# Patient Record
Sex: Female | Born: 1970
Health system: Southern US, Community
[De-identification: ages and names within clinical notes are randomized; demographics above are authoritative.]

## PROBLEM LIST (undated history)

## (undated) DIAGNOSIS — T7840XA Allergy, unspecified, initial encounter: Secondary | ICD-10-CM

## (undated) DIAGNOSIS — I10 Essential (primary) hypertension: Secondary | ICD-10-CM

## (undated) DIAGNOSIS — D649 Anemia, unspecified: Secondary | ICD-10-CM

## (undated) HISTORY — DX: Allergy, unspecified, initial encounter: T78.40XA

## (undated) HISTORY — PX: WISDOM TOOTH EXTRACTION: SHX21

## (undated) HISTORY — DX: Anemia, unspecified: D64.9

## (undated) HISTORY — DX: Essential (primary) hypertension: I10

## (undated) HISTORY — PX: SHOULDER SURGERY: SHX246

---

## 1998-01-07 ENCOUNTER — Inpatient Hospital Stay (HOSPITAL_COMMUNITY): Admission: AD | Admit: 1998-01-07 | Discharge: 1998-01-07 | Payer: Self-pay | Admitting: Obstetrics

## 1998-01-13 ENCOUNTER — Other Ambulatory Visit: Admission: RE | Admit: 1998-01-13 | Discharge: 1998-01-13 | Payer: Self-pay | Admitting: Obstetrics and Gynecology

## 1998-01-13 ENCOUNTER — Ambulatory Visit (HOSPITAL_COMMUNITY): Admission: RE | Admit: 1998-01-13 | Discharge: 1998-01-13 | Payer: Self-pay | Admitting: Obstetrics & Gynecology

## 1998-02-11 ENCOUNTER — Observation Stay (HOSPITAL_COMMUNITY): Admission: AD | Admit: 1998-02-11 | Discharge: 1998-02-11 | Payer: Self-pay | Admitting: Obstetrics and Gynecology

## 1998-02-16 ENCOUNTER — Other Ambulatory Visit: Admission: RE | Admit: 1998-02-16 | Discharge: 1998-02-16 | Payer: Self-pay | Admitting: Obstetrics and Gynecology

## 1998-09-07 ENCOUNTER — Inpatient Hospital Stay (HOSPITAL_COMMUNITY): Admission: AD | Admit: 1998-09-07 | Discharge: 1998-09-07 | Payer: Self-pay | Admitting: Obstetrics and Gynecology

## 1998-09-10 ENCOUNTER — Inpatient Hospital Stay (HOSPITAL_COMMUNITY): Admission: AD | Admit: 1998-09-10 | Discharge: 1998-09-10 | Payer: Self-pay | Admitting: Obstetrics and Gynecology

## 1998-09-10 ENCOUNTER — Inpatient Hospital Stay (HOSPITAL_COMMUNITY): Admission: AD | Admit: 1998-09-10 | Discharge: 1998-09-13 | Payer: Self-pay | Admitting: *Deleted

## 1999-09-21 ENCOUNTER — Other Ambulatory Visit: Admission: RE | Admit: 1999-09-21 | Discharge: 1999-09-21 | Payer: Self-pay | Admitting: Obstetrics and Gynecology

## 2000-09-26 ENCOUNTER — Other Ambulatory Visit: Admission: RE | Admit: 2000-09-26 | Discharge: 2000-09-26 | Payer: Self-pay | Admitting: Obstetrics and Gynecology

## 2001-10-08 ENCOUNTER — Other Ambulatory Visit: Admission: RE | Admit: 2001-10-08 | Discharge: 2001-10-08 | Payer: Self-pay | Admitting: Obstetrics and Gynecology

## 2002-10-09 ENCOUNTER — Other Ambulatory Visit: Admission: RE | Admit: 2002-10-09 | Discharge: 2002-10-09 | Payer: Self-pay | Admitting: Obstetrics and Gynecology

## 2003-03-13 ENCOUNTER — Encounter: Payer: Self-pay | Admitting: Emergency Medicine

## 2003-03-13 ENCOUNTER — Emergency Department (HOSPITAL_COMMUNITY): Admission: EM | Admit: 2003-03-13 | Discharge: 2003-03-13 | Payer: Self-pay | Admitting: Emergency Medicine

## 2003-07-03 ENCOUNTER — Encounter: Payer: Self-pay | Admitting: Emergency Medicine

## 2003-07-03 ENCOUNTER — Encounter: Admission: RE | Admit: 2003-07-03 | Discharge: 2003-07-03 | Payer: Self-pay | Admitting: Emergency Medicine

## 2003-11-04 ENCOUNTER — Other Ambulatory Visit: Admission: RE | Admit: 2003-11-04 | Discharge: 2003-11-04 | Payer: Self-pay | Admitting: Obstetrics and Gynecology

## 2004-11-23 ENCOUNTER — Emergency Department (HOSPITAL_COMMUNITY): Admission: EM | Admit: 2004-11-23 | Discharge: 2004-11-23 | Payer: Self-pay | Admitting: Emergency Medicine

## 2004-11-26 ENCOUNTER — Other Ambulatory Visit: Admission: RE | Admit: 2004-11-26 | Discharge: 2004-11-26 | Payer: Self-pay | Admitting: Obstetrics and Gynecology

## 2004-12-15 ENCOUNTER — Encounter: Admission: RE | Admit: 2004-12-15 | Discharge: 2004-12-15 | Payer: Self-pay | Admitting: Obstetrics and Gynecology

## 2004-12-28 ENCOUNTER — Emergency Department (HOSPITAL_COMMUNITY): Admission: EM | Admit: 2004-12-28 | Discharge: 2004-12-28 | Payer: Self-pay | Admitting: Emergency Medicine

## 2005-03-25 ENCOUNTER — Emergency Department (HOSPITAL_COMMUNITY): Admission: EM | Admit: 2005-03-25 | Discharge: 2005-03-25 | Payer: Self-pay | Admitting: Emergency Medicine

## 2005-11-30 ENCOUNTER — Emergency Department (HOSPITAL_COMMUNITY): Admission: EM | Admit: 2005-11-30 | Discharge: 2005-11-30 | Payer: Self-pay | Admitting: Emergency Medicine

## 2006-08-28 ENCOUNTER — Other Ambulatory Visit: Admission: RE | Admit: 2006-08-28 | Discharge: 2006-08-28 | Payer: Self-pay | Admitting: Obstetrics and Gynecology

## 2007-10-24 ENCOUNTER — Encounter: Admission: RE | Admit: 2007-10-24 | Discharge: 2007-10-24 | Payer: Self-pay | Admitting: Emergency Medicine

## 2009-08-03 ENCOUNTER — Emergency Department (HOSPITAL_COMMUNITY): Admission: EM | Admit: 2009-08-03 | Discharge: 2009-08-03 | Payer: Self-pay | Admitting: Emergency Medicine

## 2009-11-04 ENCOUNTER — Emergency Department (HOSPITAL_COMMUNITY): Admission: EM | Admit: 2009-11-04 | Discharge: 2009-11-04 | Payer: Self-pay | Admitting: Emergency Medicine

## 2010-09-16 ENCOUNTER — Emergency Department (HOSPITAL_COMMUNITY)
Admission: EM | Admit: 2010-09-16 | Discharge: 2010-09-16 | Payer: Self-pay | Source: Home / Self Care | Admitting: Family Medicine

## 2010-10-30 ENCOUNTER — Encounter: Payer: Self-pay | Admitting: Neurology

## 2010-12-20 LAB — POCT RAPID STREP A (OFFICE): Streptococcus, Group A Screen (Direct): POSITIVE — AB

## 2011-01-09 ENCOUNTER — Ambulatory Visit (INDEPENDENT_AMBULATORY_CARE_PROVIDER_SITE_OTHER): Payer: BC Managed Care – PPO

## 2011-01-09 ENCOUNTER — Inpatient Hospital Stay (INDEPENDENT_AMBULATORY_CARE_PROVIDER_SITE_OTHER)
Admission: RE | Admit: 2011-01-09 | Discharge: 2011-01-09 | Disposition: A | Discharge status: Home / Self Care | Payer: BC Managed Care – PPO | Source: Ambulatory Visit | Attending: Family Medicine | Admitting: Family Medicine

## 2011-01-09 DIAGNOSIS — K5900 Constipation, unspecified: Secondary | ICD-10-CM

## 2011-01-09 LAB — POCT PREGNANCY, URINE: Preg Test, Ur: NEGATIVE

## 2011-01-09 LAB — POCT URINALYSIS DIP (DEVICE)
Protein, ur: NEGATIVE mg/dL
Specific Gravity, Urine: 1.02 (ref 1.005–1.030)
Urobilinogen, UA: 0.2 mg/dL (ref 0.0–1.0)
pH: 7.5 (ref 5.0–8.0)

## 2011-01-13 LAB — CBC
HCT: 40.7 % (ref 36.0–46.0)
MCHC: 34.6 g/dL (ref 30.0–36.0)
Platelets: 314 10*3/uL (ref 150–400)
RDW: 12 % (ref 11.5–15.5)

## 2011-01-13 LAB — DIFFERENTIAL
Basophils Absolute: 0 10*3/uL (ref 0.0–0.1)
Basophils Relative: 0 % (ref 0–1)
Eosinophils Absolute: 0 10*3/uL (ref 0.0–0.7)
Eosinophils Relative: 0 % (ref 0–5)
Lymphocytes Relative: 5 % — ABNORMAL LOW (ref 12–46)
Monocytes Absolute: 0.4 10*3/uL (ref 0.1–1.0)

## 2011-01-13 LAB — POCT I-STAT, CHEM 8
Chloride: 103 mEq/L (ref 96–112)
HCT: 46 % (ref 36.0–46.0)
Hemoglobin: 15.6 g/dL — ABNORMAL HIGH (ref 12.0–15.0)
Potassium: 4 mEq/L (ref 3.5–5.1)
Sodium: 140 mEq/L (ref 135–145)

## 2011-01-13 LAB — POCT PREGNANCY, URINE: Preg Test, Ur: NEGATIVE

## 2011-01-13 LAB — LIPASE, BLOOD: Lipase: 13 U/L (ref 11–59)

## 2011-01-13 LAB — HEPATIC FUNCTION PANEL
ALT: 14 U/L (ref 0–35)
Bilirubin, Direct: 0.2 mg/dL (ref 0.0–0.3)
Indirect Bilirubin: 0.5 mg/dL (ref 0.3–0.9)
Total Protein: 7.5 g/dL (ref 6.0–8.3)

## 2011-06-30 ENCOUNTER — Inpatient Hospital Stay (INDEPENDENT_AMBULATORY_CARE_PROVIDER_SITE_OTHER)
Admission: RE | Admit: 2011-06-30 | Discharge: 2011-06-30 | Disposition: A | Discharge status: Home / Self Care | Payer: BC Managed Care – PPO | Source: Ambulatory Visit | Attending: Emergency Medicine | Admitting: Emergency Medicine

## 2011-06-30 DIAGNOSIS — J310 Chronic rhinitis: Secondary | ICD-10-CM

## 2011-06-30 DIAGNOSIS — J019 Acute sinusitis, unspecified: Secondary | ICD-10-CM

## 2011-07-20 ENCOUNTER — Ambulatory Visit (INDEPENDENT_AMBULATORY_CARE_PROVIDER_SITE_OTHER): Payer: BC Managed Care – PPO | Admitting: Internal Medicine

## 2011-07-20 DIAGNOSIS — J309 Allergic rhinitis, unspecified: Secondary | ICD-10-CM

## 2011-07-28 ENCOUNTER — Ambulatory Visit: Payer: BC Managed Care – PPO | Admitting: Internal Medicine

## 2011-11-18 ENCOUNTER — Encounter (HOSPITAL_COMMUNITY): Payer: Self-pay | Admitting: *Deleted

## 2011-11-18 ENCOUNTER — Emergency Department (INDEPENDENT_AMBULATORY_CARE_PROVIDER_SITE_OTHER)
Admission: EM | Admit: 2011-11-18 | Discharge: 2011-11-18 | Disposition: A | Discharge status: Home / Self Care | Payer: BC Managed Care – PPO | Source: Home / Self Care | Attending: Family Medicine | Admitting: Family Medicine

## 2011-11-18 DIAGNOSIS — J329 Chronic sinusitis, unspecified: Secondary | ICD-10-CM

## 2011-11-18 DIAGNOSIS — J069 Acute upper respiratory infection, unspecified: Secondary | ICD-10-CM

## 2011-11-18 MED ORDER — FLUTICASONE PROPIONATE 50 MCG/ACT NA SUSP: NASAL | Status: DC

## 2011-11-18 MED ORDER — IBUPROFEN 600 MG PO TABS: 600.0000 mg | ORAL_TABLET | Freq: Three times a day (TID) | ORAL | Status: AC | PRN

## 2011-11-18 MED ORDER — FEXOFENADINE-PSEUDOEPHED ER 60-120 MG PO TB12: 1.0000 | ORAL_TABLET | Freq: Two times a day (BID) | ORAL | Status: DC

## 2011-11-18 MED ORDER — AZITHROMYCIN 250 MG PO TABS: 250.0000 mg | ORAL_TABLET | Freq: Every day | ORAL | Status: AC

## 2011-11-18 NOTE — ED Provider Notes (Signed)
History     CSN: [id number]  Arrival date & time 11/18/11  1515   First MD Initiated Contact with Patient 11/18/11 1650      Chief Complaint  Patient presents with  . Sinusitis    Sinus pressure frontal and maxillary sinuses - clear discharge - no cough, no fever, onset 2 days ago. Tylenol Congestion without relief - vaporizer has helped some    (Consider location/radiation/quality/duration/timing/severity/associated sxs/prior treatment) HPI Comments: 41 year old female nonsmoker; history of recurrent sinusitis here complaining of nasal congestion, sinus pressure  and clear rhinorrhea for 2 days, no fever, no body aches, no headache, no cough, no chest pain. Appetite okay, using saline spray and is concern about developing full sinusitis.   History reviewed. No pertinent past medical history.  History reviewed. No pertinent past surgical history.  Family History  Problem Relation Age of Onset  . Cancer Mother   . Diabetes Mother   . Heart disease Father   . Hypertension Brother     History  Substance Use Topics  . Smoking status: Never Smoker   . Smokeless tobacco: Not on file  . Alcohol Use: 1.0 oz/week    2 drink(s) per week    OB History    Grav Para Term Preterm Abortions TAB SAB Ect Mult Living   2 1 1  1   1         Review of Systems  Constitutional: Negative for fever, chills and appetite change.  HENT: Positive for congestion, rhinorrhea and sinus pressure. Negative for ear pain, sore throat, facial swelling, trouble swallowing and neck pain.   Respiratory: Negative for cough, shortness of breath and wheezing.   Gastrointestinal: Negative for nausea, vomiting and abdominal pain.  Musculoskeletal: Negative for arthralgias.  Skin: Positive for color change. Negative for rash.  Neurological: Negative for headaches.    Allergies  Review of patient's allergies indicates no known allergies.  Home Medications   Current Outpatient Rx  Name Route Sig Dispense  Refill  . AZITHROMYCIN 250 MG PO TABS Oral Take 1 tablet (250 mg total) by mouth daily. Take first 2 tablets together, then 1 every day until finished. 6 tablet 0  . FEXOFENADINE-PSEUDOEPHED ER 60-120 MG PO TB12 Oral Take 1 tablet by mouth every 12 (twelve) hours. 30 tablet 0  . FLUTICASONE PROPIONATE 50 MCG/ACT NA SUSP  E/ nostril daily as needed 16 g 0  . IBUPROFEN 600 MG PO TABS Oral Take 1 tablet (600 mg total) by mouth every 8 (eight) hours as needed for pain or fever. 20 tablet 0    BP 136/89  Pulse 77  Temp(Src) 99 F (37.2 C) (Oral)  Resp 18  SpO2 98%  Physical Exam  Nursing note and vitals reviewed. Constitutional: She is oriented to person, place, and time. She appears well-developed and well-nourished. No distress.  HENT:  Head: Normocephalic and atraumatic.  Right Ear: External ear normal.  Left Ear: External ear normal.       Nasal Congestion with erythema and swelling of nasal turbinates, clear rhinorrhea. mild pharyngeal erythema no exudates. No uvula deviation. No trismus. TM's normal.  Eyes: Conjunctivae and EOM are normal. Pupils are equal, round, and reactive to light. Right eye exhibits no discharge. Left eye exhibits no discharge.  Neck: Neck supple. No JVD present.  Cardiovascular: Normal heart sounds.   Pulmonary/Chest: Breath sounds normal. No respiratory distress. She has no wheezes. She has no rales. She exhibits no tenderness.  Lymphadenopathy:    She has no  cervical adenopathy.  Neurological: She is alert and oriented to person, place, and time.  Skin: No rash noted.    ED Course  Procedures (including critical care time)  Labs Reviewed - No data to display No results found.   1. URI (upper respiratory infection)   2. Sinusitis       MDM  Clinically well early signs of sinusitis treated symptomatically prescription to hold for an antibiotic to use if persistent or worsening symptoms after 7 days, prescribed Flonase,  antihistamine/decongestant and ibuprofen; also nasal saline spray at least 3 times a day.        Sharin Grave, MD 11/19/11 1315

## 2012-01-20 ENCOUNTER — Ambulatory Visit (INDEPENDENT_AMBULATORY_CARE_PROVIDER_SITE_OTHER): Payer: BC Managed Care – PPO | Admitting: Internal Medicine

## 2012-01-20 ENCOUNTER — Encounter: Payer: Self-pay | Admitting: Internal Medicine

## 2012-01-20 VITALS — BP 118/72 | HR 92 | Temp 99.9°F | Resp 16 | Ht 66.0 in | Wt 166.0 lb

## 2012-01-20 DIAGNOSIS — J309 Allergic rhinitis, unspecified: Secondary | ICD-10-CM | POA: Insufficient documentation

## 2012-01-20 DIAGNOSIS — M19019 Primary osteoarthritis, unspecified shoulder: Secondary | ICD-10-CM

## 2012-01-20 DIAGNOSIS — M12819 Other specific arthropathies, not elsewhere classified, unspecified shoulder: Secondary | ICD-10-CM

## 2012-01-20 MED ORDER — FLUTICASONE PROPIONATE 50 MCG/ACT NA SUSP: NASAL | Status: DC

## 2012-01-20 MED ORDER — NAPROXEN SODIUM ER 500 MG PO TB24: 500.0000 mg | ORAL_TABLET | Freq: Every day | ORAL | Status: DC

## 2012-01-20 MED ORDER — FEXOFENADINE-PSEUDOEPHED ER 60-120 MG PO TB12: 1.0000 | ORAL_TABLET | Freq: Two times a day (BID) | ORAL | Status: AC

## 2012-01-20 NOTE — Patient Instructions (Signed)
Shoulder Pain The shoulder is a ball and socket joint. The muscles and tendons (rotator cuff) are what keep the shoulder in its joint and stable. This collection of muscles and tendons holds in the head (ball) of the humerus (upper arm bone) in the fossa (cup) of the scapula (shoulder blade). Today no reason was found for your shoulder pain. Often pain in the shoulder may be treated conservatively with temporary immobilization. For example, holding the shoulder in one place using a sling for rest. Physical therapy may be needed if problems continue. HOME CARE INSTRUCTIONS   Apply ice to the sore area for 15 to 20 minutes, 3 to 4 times per day for the first 2 days. Put the ice in a plastic bag. Place a towel between the bag of ice and your skin.   If you have or were given a shoulder sling and straps, do not remove for as long as directed by your caregiver or until you see a caregiver for a follow-up examination. If you need to remove it to shower or bathe, move your arm as little as possible.   Sleep on several pillows at night to lessen swelling and pain.   Only take over-the-counter or prescription medicines for pain, discomfort, or fever as directed by your caregiver.   Keep any follow-up appointments in order to avoid any type of permanent shoulder disability or chronic pain problems.  SEEK MEDICAL CARE IF:   Pain in your shoulder increases or new pain develops in your arm, hand, or fingers.   Your hand or fingers are colder than your other hand.   You do not obtain pain relief with the medications or your pain becomes worse.  SEEK IMMEDIATE MEDICAL CARE IF:   Your arm, hand, or fingers are numb or tingling.   Your arm, hand, or fingers are swollen, painful, or turn white or blue.   You develop chest pain or shortness of breath.  MAKE SURE YOU:   Understand these instructions.   Will watch your condition.   Will get help right away if you are not doing well or get worse.    Document Released: 07/06/2005 Document Revised: 09/15/2011 Document Reviewed: 09/10/2011 [employer] Patient Information 2012 [employer]. 

## 2012-01-21 NOTE — Assessment & Plan Note (Signed)
She will restart her usual meds

## 2012-01-21 NOTE — Assessment & Plan Note (Signed)
She will try nsaids and will f/up about possible referral for PT, MRI, ortho referral

## 2012-01-21 NOTE — Progress Notes (Signed)
Subjective:    Patient ID: Teresa Blankenship, female    DOB: [birth year], 41 y.o.   MRN: [id number]  Allergic Reaction This is a recurrent problem. The current episode started more than 1 week ago. The problem occurs intermittently. The problem has been gradually worsening since onset. The problem is mild. Associated with: pollen. The time of exposure was just prior to onset. The exposure occurred at home. Associated symptoms include eye itching. Pertinent negatives include no abdominal pain, chest pain, chest pressure, coughing, diarrhea, difficulty breathing, drooling, eye redness, eye watering, globus sensation, hyperventilation, itching, rash, stridor, trouble swallowing, vomiting or wheezing. There is no swelling present. Past treatments include one or more prescription drugs. The treatment provided moderate relief. Her past medical history is significant for seasonal allergies. (Rotator cuff syndrome diagnosed 2 years ago by ? ortho)  Shoulder Pain  The pain is present in the right shoulder and left shoulder. This is a recurrent problem. The current episode started more than 1 year ago. There has been no history of extremity trauma. The problem occurs intermittently. The problem has been unchanged. The pain is at a severity of 2/10. The pain is mild. Associated symptoms include a limited range of motion. Pertinent negatives include no itching, joint locking, joint swelling, numbness, stiffness or tingling. She has tried acetaminophen and NSAIDS for the symptoms. The treatment provided moderate relief. Her past medical history is significant for osteoarthritis. rotator cuff syndrome diagnosed 2 years ago by ? ortho      Review of Systems  Constitutional: Negative.   HENT: Positive for congestion, rhinorrhea, sneezing and postnasal drip. Negative for hearing loss, ear pain, nosebleeds, sore throat, facial swelling, drooling, mouth sores, trouble swallowing, neck pain, neck stiffness, dental problem,  voice change, sinus pressure, tinnitus and ear discharge.   Eyes: Positive for itching. Negative for photophobia, pain, discharge, redness and visual disturbance.  Respiratory: Negative for cough, chest tightness, wheezing and stridor.   Cardiovascular: Negative for chest pain, palpitations and leg swelling.  Gastrointestinal: Negative.  Negative for nausea, vomiting, abdominal pain and diarrhea.  Genitourinary: Negative.   Musculoskeletal: Positive for arthralgias (both shoulders). Negative for myalgias, back pain, joint swelling, gait problem and stiffness.  Skin: Negative for color change, itching, pallor, rash and wound.  Neurological: Negative.  Negative for tingling and numbness.  Hematological: Negative for adenopathy. Does not bruise/bleed easily.  Psychiatric/Behavioral: Negative.        Objective:   Physical Exam  Vitals reviewed. Constitutional: She is oriented to person, place, and time. She appears well-developed and well-nourished. No distress.  HENT:  Head: Normocephalic and atraumatic. No trismus in the jaw.  Right Ear: Hearing, tympanic membrane, external ear and ear canal normal.  Left Ear: Hearing, tympanic membrane, external ear and ear canal normal.  Nose: Mucosal edema and rhinorrhea present. No sinus tenderness, nasal deformity, septal deviation or nasal septal hematoma. No epistaxis.  No foreign bodies. Right sinus exhibits no maxillary sinus tenderness and no frontal sinus tenderness. Left sinus exhibits no maxillary sinus tenderness and no frontal sinus tenderness.  Mouth/Throat: Mucous membranes are normal. Mucous membranes are not pale, not dry and not cyanotic. No uvula swelling. No oropharyngeal exudate, posterior oropharyngeal edema, posterior oropharyngeal erythema or tonsillar abscesses.  Eyes: Conjunctivae are normal. Right eye exhibits no discharge. Left eye exhibits no discharge. No scleral icterus.  Neck: Normal range of motion. Neck supple. No JVD  present. No tracheal deviation present. No thyromegaly present.  Cardiovascular: Normal rate, regular rhythm, normal heart  sounds and intact distal pulses.  Exam reveals no gallop and no friction rub.   No murmur heard. Pulmonary/Chest: Effort normal and breath sounds normal. No stridor. No respiratory distress. She has no wheezes. She has no rales. She exhibits no tenderness.  Abdominal: Soft. Bowel sounds are normal. She exhibits no distension and no mass. There is no tenderness. There is no rebound and no guarding.  Musculoskeletal: Normal range of motion. She exhibits no edema and no tenderness.       Right shoulder: Normal. She exhibits normal range of motion, no tenderness, no bony tenderness, no swelling, no effusion, no crepitus, no deformity, no laceration, no pain, no spasm, normal pulse and normal strength.       Left shoulder: Normal. She exhibits normal range of motion, no tenderness, no bony tenderness, no swelling, no effusion, no crepitus, no deformity, no laceration, no pain, no spasm, normal pulse and normal strength.  Lymphadenopathy:    She has no cervical adenopathy.  Neurological: She is oriented to person, place, and time.  Skin: Skin is warm and dry. No rash noted. She is not diaphoretic. No erythema. No pallor.  Psychiatric: She has a normal mood and affect. Her behavior is normal. Judgment and thought content normal.     Lab Results  Component Value Date   WBC 15.0* 08/03/2009   HGB 15.6* 08/03/2009   HCT 46.0 08/03/2009   PLT 314 08/03/2009   GLUCOSE 119* 08/03/2009   ALT 14 08/03/2009   AST 24 08/03/2009   NA 140 08/03/2009   K 4.0 08/03/2009   CL 103 08/03/2009   CREATININE 0.6 08/03/2009   BUN 8 08/03/2009      Assessment & Plan:

## 2012-02-03 ENCOUNTER — Telehealth: Payer: Self-pay

## 2012-02-03 DIAGNOSIS — J309 Allergic rhinitis, unspecified: Secondary | ICD-10-CM

## 2012-02-03 MED ORDER — DEXAMETHASONE 1.5 MG PO KIT: 1.0000 | PACK | ORAL | Status: DC

## 2012-02-03 NOTE — Telephone Encounter (Signed)
RX for allegra sent 01/20/12 to pharmacy but didn't fill due to OTC. Per pt medication is too expensive OTC and she would like for MD to send in something different for allergies. Thanks

## 2012-02-03 NOTE — Telephone Encounter (Signed)
done

## 2012-05-29 ENCOUNTER — Other Ambulatory Visit: Payer: Self-pay

## 2012-05-29 DIAGNOSIS — M12819 Other specific arthropathies, not elsewhere classified, unspecified shoulder: Secondary | ICD-10-CM

## 2012-05-29 MED ORDER — NAPROXEN SODIUM ER 500 MG PO TB24: 500.0000 mg | ORAL_TABLET | Freq: Every day | ORAL | Status: DC

## 2012-05-30 ENCOUNTER — Telehealth: Payer: Self-pay

## 2012-05-30 NOTE — Telephone Encounter (Signed)
PA started and form faxed back to company

## 2012-05-30 NOTE — Telephone Encounter (Signed)
Pt called stating [facility] advised that PA is needed and has been sent for Naproxen 500 mg ER. Please advise pt on PA status once received.

## 2012-06-01 ENCOUNTER — Telehealth: Payer: Self-pay | Admitting: Internal Medicine

## 2012-06-01 DIAGNOSIS — J309 Allergic rhinitis, unspecified: Secondary | ICD-10-CM

## 2012-06-01 DIAGNOSIS — M12819 Other specific arthropathies, not elsewhere classified, unspecified shoulder: Secondary | ICD-10-CM

## 2012-06-01 MED ORDER — NAPROXEN 375 MG PO TABS: 375.0000 mg | ORAL_TABLET | Freq: Two times a day (BID) | ORAL | Status: AC

## 2012-06-01 NOTE — Telephone Encounter (Signed)
Per insurance PA approved 04/29/12-06/01/13. Pharmacy notified

## 2012-06-01 NOTE — Telephone Encounter (Signed)
changed

## 2012-06-01 NOTE — Telephone Encounter (Signed)
Received fax from [facility] stating that the [facility] CR 24 hr 500 mg is to expensive for the patient. Per the pharmacy this medication does not come in a generic. Can we change this med to something else?

## 2012-06-14 ENCOUNTER — Encounter: Payer: Self-pay | Admitting: Internal Medicine

## 2012-06-14 ENCOUNTER — Ambulatory Visit (INDEPENDENT_AMBULATORY_CARE_PROVIDER_SITE_OTHER): Payer: BC Managed Care – PPO | Admitting: Internal Medicine

## 2012-06-14 VITALS — BP 104/68 | HR 78 | Temp 98.5°F | Resp 16 | Wt 168.0 lb

## 2012-06-14 DIAGNOSIS — M19019 Primary osteoarthritis, unspecified shoulder: Secondary | ICD-10-CM

## 2012-06-14 DIAGNOSIS — M12819 Other specific arthropathies, not elsewhere classified, unspecified shoulder: Secondary | ICD-10-CM

## 2012-06-14 NOTE — Patient Instructions (Signed)
Shoulder Pain The shoulder is a ball and socket joint. The muscles and tendons (rotator cuff) are what keep the shoulder in its joint and stable. This collection of muscles and tendons holds in the head (ball) of the humerus (upper arm bone) in the fossa (cup) of the scapula (shoulder blade). Today no reason was found for your shoulder pain. Often pain in the shoulder may be treated conservatively with temporary immobilization. For example, holding the shoulder in one place using a sling for rest. Physical therapy may be needed if problems continue. HOME CARE INSTRUCTIONS   Apply ice to the sore area for 15 to 20 minutes, 3 to 4 times per day for the first 2 days. Put the ice in a plastic bag. Place a towel between the bag of ice and your skin.   If you have or were given a shoulder sling and straps, do not remove for as long as directed by your caregiver or until you see a caregiver for a follow-up examination. If you need to remove it to shower or bathe, move your arm as little as possible.   Sleep on several pillows at night to lessen swelling and pain.   Only take over-the-counter or prescription medicines for pain, discomfort, or fever as directed by your caregiver.   Keep any follow-up appointments in order to avoid any type of permanent shoulder disability or chronic pain problems.  SEEK MEDICAL CARE IF:   Pain in your shoulder increases or new pain develops in your arm, hand, or fingers.   Your hand or fingers are colder than your other hand.   You do not obtain pain relief with the medications or your pain becomes worse.  SEEK IMMEDIATE MEDICAL CARE IF:   Your arm, hand, or fingers are numb or tingling.   Your arm, hand, or fingers are swollen, painful, or turn white or blue.   You develop chest pain or shortness of breath.  MAKE SURE YOU:   Understand these instructions.   Will watch your condition.   Will get help right away if you are not doing well or get worse.    Document Released: 07/06/2005 Document Revised: 09/15/2011 Document Reviewed: 09/10/2011 [employer] Patient Information 2012 [employer]. 

## 2012-06-17 ENCOUNTER — Encounter: Payer: Self-pay | Admitting: Internal Medicine

## 2012-06-17 NOTE — Progress Notes (Signed)
  Subjective:    Patient ID: Teresa Blankenship, female    DOB: 1970/12/02, 41 y.o.   MRN: [id number]  Shoulder Pain  The pain is present in the left shoulder. This is a recurrent problem. The current episode started more than 1 month ago. There has been no history of extremity trauma. The problem occurs intermittently. The problem has been gradually worsening. The quality of the pain is described as aching. The pain is mild. Associated symptoms include a limited range of motion and stiffness. Pertinent negatives include no fever, joint locking, joint swelling, numbness or tingling. The symptoms are aggravated by activity. She has tried NSAIDS for the symptoms. The treatment provided mild relief.      Review of Systems  Constitutional: Negative.  Negative for fever.  HENT: Negative.   Eyes: Negative.   Respiratory: Negative.   Cardiovascular: Negative.   Gastrointestinal: Negative.   Genitourinary: Negative.   Musculoskeletal: Positive for arthralgias and stiffness. Negative for myalgias, back pain, joint swelling and gait problem.  Skin: Negative.   Neurological: Negative.  Negative for tingling and numbness.  Hematological: Negative.   Psychiatric/Behavioral: Negative.        Objective:   Physical Exam  Musculoskeletal:       Left shoulder: She exhibits decreased range of motion. She exhibits no tenderness, no bony tenderness, no swelling, no effusion, no crepitus, no deformity, no laceration, no pain, no spasm, normal pulse and normal strength.        Assessment & Plan:

## 2012-06-17 NOTE — Assessment & Plan Note (Signed)
Ortho referral  

## 2012-12-19 ENCOUNTER — Encounter: Payer: Self-pay | Admitting: Obstetrics and Gynecology

## 2012-12-19 ENCOUNTER — Other Ambulatory Visit: Payer: Self-pay | Admitting: Obstetrics and Gynecology

## 2012-12-19 ENCOUNTER — Ambulatory Visit: Payer: BC Managed Care – PPO | Admitting: Obstetrics and Gynecology

## 2012-12-19 VITALS — BP 114/64 | Ht 66.0 in | Wt 178.0 lb

## 2012-12-19 DIAGNOSIS — Z01419 Encounter for gynecological examination (general) (routine) without abnormal findings: Secondary | ICD-10-CM

## 2012-12-19 DIAGNOSIS — Z1231 Encounter for screening mammogram for malignant neoplasm of breast: Secondary | ICD-10-CM

## 2012-12-19 DIAGNOSIS — Z124 Encounter for screening for malignant neoplasm of cervix: Secondary | ICD-10-CM

## 2012-12-19 NOTE — Progress Notes (Signed)
The patient reports:no complaints   Contraception:condoms   Last mammogram:12/16/2004 Normal Last pap: 08/28/2006 Normal  GC/Chlamydia cultures offered: declined HIV/RPR/HbsAg offered:  declined HSV 1 and 2 glycoprotein offered: declined  Menstrual cycle regular and monthly: Yes every 28 days  Menstrual flow normal: Yes lasts 5 days   Urinary symptoms: none Normal bowel movements: constipation issues  Reports abuse at home: No:   Subjective:    Teresa Blankenship is a 42 y.o. female, G3P1011, who presents for an annual exam without complaints.    History   Social History  . Marital Status: Married    Spouse Name: N/A    Number of Children: N/A  . Years of Education: N/A   Social History Main Topics  . Smoking status: Never Smoker   . Smokeless tobacco: Never Used  . Alcohol Use: 1.0 oz/week    2 drink(s) per week     Comment: occasional liquor  . Drug Use: No  . Sexually Active: Yes    Birth Control/ Protection: Condom   Other Topics Concern  . None   Social History Narrative  . None    Menstrual cycle:   LMP: Patient's last menstrual period was 11/22/2012.           Cycle: normal  The following portions of the patient's history were reviewed and updated as appropriate: allergies, current medications, past family history, past medical history, past social history, past surgical history and problem list.  Review of Systems Pertinent items are noted in HPI. Breast:Negative for breast lump,nipple discharge or nipple retraction Gastrointestinal: Negative for abdominal pain, change in bowel habits or rectal bleeding Urinary:negative   Objective:    BP 114/64  Ht 5\' 6"  (1.676 m)  Wt 178 lb (80.74 kg)  BMI 28.74 kg/m2  LMP 11/22/2012    Weight:  Wt Readings from Last 1 Encounters:  12/19/12 178 lb (80.74 kg)          BMI: Body mass index is 28.74 kg/(m^2).  General Appearance: Alert, appropriate appearance for age. No acute distress HEENT: Grossly normal Neck  / Thyroid: Supple, no masses, nodes or enlargement Lungs: clear to auscultation bilaterally Back: No CVA tenderness Breast Exam: No masses or nodes.No dimpling, nipple retraction or discharge. Cardiovascular: Regular rate and rhythm. S1, S2, no murmur Gastrointestinal: Soft, non-tender, no masses or organomegaly Pelvic Exam: Vulva and vagina appear normal. Bimanual exam reveals normal uterus and adnexa. Rectovaginal: normal rectal, no masses Lymphatic Exam: Non-palpable nodes in neck, clavicular, axillary, or inguinal regions  Skin: no rash or abnormalities Neurologic: Normal gait and speech, no tremor  Psychiatric: Alert and oriented, appropriate affect.     Assessment:    Normal gyn exam  Contraception   Plan:    Mammogram pap smear done, next pap due 2017 return annually or prn STD screening: discussed Contraception:no method  Mirena was reviewed with the patient  With expected benefits of: lack of estrogen,5 year duration, high reliability at 99.9%, reversibility, reduction or cessation of menstrual flow and improvement or resolution of dysmenorrhea/pelvic pain. Risks at the time of insertion were reviewed: dysfunctional uterine bleeding lasting up to 6 months, infection and uterine perforation which may require laparoscopy for retrieval.   Instructions for day of insertion discussed:  yes avoidance of unprotected intercourse 2 weeks prior, best to schedule during a menstrual cycle and use of Ibuprofen 600 mg 1 hour before appointment.   GC/Chlamydia was collected at this visit: no  Nexplanon was reviewed with the patient  With expected benefits of: 3 year duration, high reliability at 99.9%, lack of estrogen and ease of insertion. Risks of DUB and possible difficult removal discussed   Combined oral contraceptives was reviewed with the patient      With expected benefits of: cycle control, reduction in menstrual flow and dysmenorrhea, improvement of PMS and reduction  of ovarian cysts and ovarian cancer. Risks of DVT/PE discussed.  Nuvaring was also reviewed With added benefit of monthly use as opposed to daily use was also reviewed.  Tobacco use: none, withouthistory of DVT/PE. Pertinent medical history:none  Pt undecided. Will call back     Silverio Lay MD       n

## 2012-12-21 ENCOUNTER — Telehealth: Payer: Self-pay

## 2012-12-21 LAB — PAP IG W/ RFLX HPV ASCU

## 2012-12-21 NOTE — Telephone Encounter (Signed)
Pt decided that she is wanting to get IUD mirena. Please verify pt insurance and call pt to schedule apt.  Darien Ramus, CMA

## 2013-01-07 ENCOUNTER — Ambulatory Visit
Admission: RE | Admit: 2013-01-07 | Discharge: 2013-01-07 | Disposition: A | Discharge status: Home / Self Care | Payer: BC Managed Care – PPO | Source: Ambulatory Visit | Attending: Obstetrics and Gynecology | Admitting: Obstetrics and Gynecology

## 2013-01-07 DIAGNOSIS — Z1231 Encounter for screening mammogram for malignant neoplasm of breast: Secondary | ICD-10-CM

## 2013-06-14 ENCOUNTER — Encounter (HOSPITAL_COMMUNITY): Payer: Self-pay | Admitting: Emergency Medicine

## 2013-06-14 ENCOUNTER — Emergency Department (INDEPENDENT_AMBULATORY_CARE_PROVIDER_SITE_OTHER)
Admission: EM | Admit: 2013-06-14 | Discharge: 2013-06-14 | Disposition: A | Discharge status: Home / Self Care | Payer: BC Managed Care – PPO | Source: Home / Self Care

## 2013-06-14 DIAGNOSIS — J309 Allergic rhinitis, unspecified: Secondary | ICD-10-CM

## 2013-06-14 DIAGNOSIS — J45909 Unspecified asthma, uncomplicated: Secondary | ICD-10-CM

## 2013-06-14 MED ORDER — ALBUTEROL SULFATE (5 MG/ML) 0.5% IN NEBU
INHALATION_SOLUTION | RESPIRATORY_TRACT | Status: AC
  Filled 2013-06-14: qty 1

## 2013-06-14 MED ORDER — FLUTICASONE PROPIONATE 50 MCG/ACT NA SUSP: 2.0000 | Freq: Every day | NASAL | Status: DC

## 2013-06-14 MED ORDER — METHYLPREDNISOLONE (PAK) 4 MG PO TABS: ORAL_TABLET | ORAL | Status: DC

## 2013-06-14 MED ORDER — ALBUTEROL SULFATE HFA 108 (90 BASE) MCG/ACT IN AERS: 1.0000 | INHALATION_SPRAY | Freq: Four times a day (QID) | RESPIRATORY_TRACT | Status: DC | PRN

## 2013-06-14 MED ORDER — ALBUTEROL SULFATE (5 MG/ML) 0.5% IN NEBU
5.0000 mg | INHALATION_SOLUTION | Freq: Once | RESPIRATORY_TRACT | Status: AC
  Administered 2013-06-14: 5 mg via RESPIRATORY_TRACT

## 2013-06-14 MED ORDER — IPRATROPIUM BROMIDE 0.02 % IN SOLN
0.5000 mg | Freq: Once | RESPIRATORY_TRACT | Status: AC
  Administered 2013-06-14: 0.5 mg via RESPIRATORY_TRACT

## 2013-06-14 MED ORDER — TRIAMCINOLONE ACETONIDE 40 MG/ML IJ SUSP
INTRAMUSCULAR | Status: AC
  Filled 2013-06-14: qty 1

## 2013-06-14 MED ORDER — TRIAMCINOLONE ACETONIDE 40 MG/ML IJ SUSP
40.0000 mg | Freq: Once | INTRAMUSCULAR | Status: AC
  Administered 2013-06-14: 40 mg via INTRAMUSCULAR

## 2013-06-14 NOTE — ED Provider Notes (Signed)
CSN: [id number]     Arrival date & time 06/14/13  7829 History   First MD Initiated Contact with Patient 06/14/13 0957     Chief Complaint  Patient presents with  . Facial Pain    since tuesday. productive cough with clear sputum. post nasal drip  . Cough   (Consider location/radiation/quality/duration/timing/severity/associated sxs/prior Treatment) HPI Comments: 42 year old female complaining of upper respiratory congestion, nasal congestion, PND and increasing cough. The symptoms began apparently 2 days ago. She does not have a history of asthma or smoking.   Past Medical History  Diagnosis Date  . Allergy   . Rotator cuff tendinitis 2008   Past Surgical History  Procedure Laterality Date  . Wisdom tooth extraction     Family History  Problem Relation Age of Onset  . Cancer Mother   . Diabetes Mother   . Heart disease Father   . Hypertension Brother    History  Substance Use Topics  . Smoking status: Never Smoker   . Smokeless tobacco: Never Used  . Alcohol Use: 1.0 oz/week    2 drink(s) per week     Comment: occasional liquor   OB History   Grav Para Term Preterm Abortions TAB SAB Ect Mult Living   3 1 1  1   1  1      Review of Systems  Constitutional: Positive for activity change. Negative for fever and diaphoresis.  HENT: Positive for congestion, rhinorrhea and postnasal drip. Negative for ear pain and neck pain.   Respiratory: Positive for cough and shortness of breath.   Cardiovascular: Negative for chest pain and palpitations.  Gastrointestinal: Negative.   Genitourinary: Negative.     Allergies  Review of patient's allergies indicates no known allergies.  Home Medications   Current Outpatient Rx  Name  Route  Sig  Dispense  Refill  . albuterol (PROVENTIL HFA;VENTOLIN HFA) 108 (90 BASE) MCG/ACT inhaler   Inhalation   Inhale 1-2 puffs into the lungs every 6 (six) hours as needed for wheezing.   1 Inhaler   0   . fluticasone (FLONASE) 50 MCG/ACT  nasal spray      2 puffs each nostril qd   16 g   11   . fluticasone (FLONASE) 50 MCG/ACT nasal spray   Nasal   Place 2 sprays into the nose daily.   16 g   2   . methylPREDNIsolone (MEDROL DOSPACK) 4 MG tablet      follow package directions   21 tablet   0    BP 156/89  Pulse 98  Temp(Src) 99.3 F (37.4 C) (Oral)  Resp 24  SpO2 97%  LMP 06/11/2013 Physical Exam  Nursing note and vitals reviewed. Constitutional: She is oriented to person, place, and time. She appears well-developed and well-nourished. No distress.  HENT:  Mouth/Throat: No oropharyngeal exudate.  Bilateral TMs are normal Oral pharynx with minor erythema and cobblestoning.  Eyes: Conjunctivae and EOM are normal.  Neck: Normal range of motion. Neck supple.  Cardiovascular: Normal rate, regular rhythm and normal heart sounds.   Pulmonary/Chest: Effort normal. No respiratory distress. She has wheezes. She has no rales.  Distant wheezes bilaterally with forced expiration and diffuse coarseness associated with cough.  Musculoskeletal: Normal range of motion. She exhibits no edema.  Lymphadenopathy:    She has no cervical adenopathy.  Neurological: She is alert and oriented to person, place, and time.  Skin: Skin is warm and dry. No rash noted.  Psychiatric: She has a  normal mood and affect.    ED Course  Procedures (including critical care time) Labs Review Labs Reviewed - No data to display Imaging Review No results found.  MDM   1. RAD (reactive airway disease) with wheezing   2. Allergic rhinitis due to allergen      Duoneb with good results. Improved air movement and reduced wheezing. Pt st is breathing better with less coughing Kenalog 40mg   IM Medrol dose pack to start tomorrow Albuterol HFA Fluticasone NS Claritin 10mg  q d Sudafed PE 10mg  prn congestion     Hayden Rasmussen, NP 06/14/13 1052

## 2013-06-14 NOTE — ED Notes (Signed)
Waiting discharge papers 

## 2013-06-14 NOTE — ED Notes (Signed)
C/o productive cough with clear sputum. sinus pressure and pain. Low grade temp. Onset Tuesday.  Pt has tried otc meds with mild relief in symptoms. Denies n/v/d

## 2013-06-15 NOTE — ED Provider Notes (Signed)
Medical screening examination/treatment/procedure(s) were performed by resident physician or non-physician practitioner and as supervising physician I was immediately available for consultation/collaboration.   Eveline Sauve DOUGLAS MD.   Lenore Moyano D Isadore Bokhari, MD 06/15/13 1109 

## 2013-06-17 NOTE — ED Notes (Signed)
Patient c/o continued URI type symptoms. Was advised to give medication at least 1 week to do their job, as MD does not Rx antibiotics unless they see signs of bacterial infections. Advised if no better after 1 week to return to clinic for care or see her regular MD for evaluation. Pt verbalized understanding of plan

## 2013-10-14 ENCOUNTER — Emergency Department (INDEPENDENT_AMBULATORY_CARE_PROVIDER_SITE_OTHER): Payer: BC Managed Care – PPO

## 2013-10-14 ENCOUNTER — Encounter (HOSPITAL_COMMUNITY): Payer: Self-pay | Admitting: Emergency Medicine

## 2013-10-14 ENCOUNTER — Emergency Department (HOSPITAL_COMMUNITY)
Admission: EM | Admit: 2013-10-14 | Discharge: 2013-10-14 | Disposition: A | Discharge status: Home / Self Care | Payer: BC Managed Care – PPO | Source: Home / Self Care | Attending: Emergency Medicine | Admitting: Emergency Medicine

## 2013-10-14 DIAGNOSIS — J019 Acute sinusitis, unspecified: Secondary | ICD-10-CM

## 2013-10-14 LAB — POCT RAPID STREP A: STREPTOCOCCUS, GROUP A SCREEN (DIRECT): NEGATIVE

## 2013-10-14 MED ORDER — METHYLPREDNISOLONE (PAK) 4 MG PO TABS: ORAL_TABLET | ORAL | Status: DC

## 2013-10-14 MED ORDER — AMOXICILLIN-POT CLAVULANATE 875-125 MG PO TABS: 1.0000 | ORAL_TABLET | Freq: Two times a day (BID) | ORAL | Status: DC

## 2013-10-14 NOTE — Discharge Instructions (Signed)
Use her Flonase, 2 sprays in each nostril twice daily, in addition to the prescriptions. Use Advil sinus as needed for symptoms. Followup if not beginning to improve after a few days.    Sinusitis Sinusitis is redness, soreness, and swelling (inflammation) of the paranasal sinuses. Paranasal sinuses are air pockets within the bones of your face (beneath the eyes, the middle of the forehead, or above the eyes). In healthy paranasal sinuses, mucus is able to drain out, and air is able to circulate through them by way of your nose. However, when your paranasal sinuses are inflamed, mucus and air can become trapped. This can allow bacteria and other germs to grow and cause infection. Sinusitis can develop quickly and last only a short time (acute) or continue over a long period (chronic). Sinusitis that lasts for more than 12 weeks is considered chronic.  CAUSES  Causes of sinusitis include:  Allergies.  Structural abnormalities, such as displacement of the cartilage that separates your nostrils (deviated septum), which can decrease the air flow through your nose and sinuses and affect sinus drainage.  Functional abnormalities, such as when the small hairs (cilia) that line your sinuses and help remove mucus do not work properly or are not present. SYMPTOMS  Symptoms of acute and chronic sinusitis are the same. The primary symptoms are pain and pressure around the affected sinuses. Other symptoms include:  Upper toothache.  Earache.  Headache.  Bad breath.  Decreased sense of smell and taste.  A cough, which worsens when you are lying flat.  Fatigue.  Fever.  Thick drainage from your nose, which often is green and may contain pus (purulent).  Swelling and warmth over the affected sinuses. DIAGNOSIS  Your caregiver will perform a physical exam. During the exam, your caregiver may:  Look in your nose for signs of abnormal growths in your nostrils (nasal polyps).  Tap over the  affected sinus to check for signs of infection.  View the inside of your sinuses (endoscopy) with a special imaging device with a light attached (endoscope), which is inserted into your sinuses. If your caregiver suspects that you have chronic sinusitis, one or more of the following tests may be recommended:  Allergy tests.  Nasal culture A sample of mucus is taken from your nose and sent to a lab and screened for bacteria.  Nasal cytology A sample of mucus is taken from your nose and examined by your caregiver to determine if your sinusitis is related to an allergy. TREATMENT  Most cases of acute sinusitis are related to a viral infection and will resolve on their own within 10 days. Sometimes medicines are prescribed to help relieve symptoms (pain medicine, decongestants, nasal steroid sprays, or saline sprays).  However, for sinusitis related to a bacterial infection, your caregiver will prescribe antibiotic medicines. These are medicines that will help kill the bacteria causing the infection.  Rarely, sinusitis is caused by a fungal infection. In theses cases, your caregiver will prescribe antifungal medicine. For some cases of chronic sinusitis, surgery is needed. Generally, these are cases in which sinusitis recurs more than 3 times per year, despite other treatments. HOME CARE INSTRUCTIONS   Drink plenty of water. Water helps thin the mucus so your sinuses can drain more easily.  Use a humidifier.  Inhale steam 3 to 4 times a day (for example, sit in the bathroom with the shower running).  Apply a warm, moist washcloth to your face 3 to 4 times a day, or as directed  by your caregiver.  Use saline nasal sprays to help moisten and clean your sinuses.  Take over-the-counter or prescription medicines for pain, discomfort, or fever only as directed by your caregiver. SEEK IMMEDIATE MEDICAL CARE IF:  You have increasing pain or severe headaches.  You have nausea, vomiting, or  drowsiness.  You have swelling around your face.  You have vision problems.  You have a stiff neck.  You have difficulty breathing. MAKE SURE YOU:   Understand these instructions.  Will watch your condition.  Will get help right away if you are not doing well or get worse. Document Released: 09/26/2005 Document Revised: 12/19/2011 Document Reviewed: 10/11/2011 [facility] Patient Information 2014 [place], [place].

## 2013-10-14 NOTE — ED Provider Notes (Signed)
CSN: [id number]     Arrival date & time 10/14/13  1052 History   First MD Initiated Contact with Patient 10/14/13 1239     Chief Complaint  Patient presents with  . URI   (Consider location/radiation/quality/duration/timing/severity/associated sxs/prior Treatment) HPI Comments: 43 year old female presents complaining of headache, sinus pressure, cough, sore throat, nasal congestion, upper tooth and jaw pain, nasal congestion. This began on Saturday, 5 days ago, and has gotten progressively worse. She has a close contact with sick-her daughter has a sinus infection that she believes she may also have a sinus infection. Her temperature was most recently measured at home as 102 this morning. She has taken Tylenol which she believes has brought the fever down. no recent travel. Also admits to one episode of vomiting, most recently yesterday, this has been posttussive vomiting  Patient is a 43 y.o. female presenting with URI.  URI Presenting symptoms: congestion, cough, fatigue, fever, rhinorrhea and sore throat   Associated symptoms: no arthralgias and no myalgias     Past Medical History  Diagnosis Date  . Allergy   . Rotator cuff tendinitis 2008   Past Surgical History  Procedure Laterality Date  . Wisdom tooth extraction     Family History  Problem Relation Age of Onset  . Cancer Mother   . Diabetes Mother   . Heart disease Father   . Hypertension Brother    History  Substance Use Topics  . Smoking status: Never Smoker   . Smokeless tobacco: Never Used  . Alcohol Use: 1.0 oz/week    2 drink(s) per week     Comment: occasional liquor   OB History   Grav Para Term Preterm Abortions TAB SAB Ect Mult Living   3 1 1  1   1  1      Review of Systems  Constitutional: Positive for fever, chills and fatigue.  HENT: Positive for congestion, rhinorrhea, sinus pressure and sore throat. Negative for postnasal drip.   Eyes: Negative for visual disturbance.  Respiratory: Positive for  cough. Negative for chest tightness and shortness of breath.   Cardiovascular: Negative for chest pain, palpitations and leg swelling.  Gastrointestinal: Positive for vomiting. Negative for nausea and abdominal pain.  Endocrine: Negative for polydipsia and polyuria.  Genitourinary: Negative for dysuria, urgency and frequency.  Musculoskeletal: Negative for arthralgias and myalgias.  Skin: Negative for rash.  Neurological: Negative for dizziness, weakness and light-headedness.    Allergies  Review of patient's allergies indicates no known allergies.  Home Medications   Current Outpatient Rx  Name  Route  Sig  Dispense  Refill  . albuterol (PROVENTIL HFA;VENTOLIN HFA) 108 (90 BASE) MCG/ACT inhaler   Inhalation   Inhale 1-2 puffs into the lungs every 6 (six) hours as needed for wheezing.   1 Inhaler   0   . amoxicillin-clavulanate (AUGMENTIN) 875-125 MG per tablet   Oral   Take 1 tablet by mouth every 12 (twelve) hours.   20 tablet   0   . fluticasone (FLONASE) 50 MCG/ACT nasal spray      2 puffs each nostril qd   16 g   11   . fluticasone (FLONASE) 50 MCG/ACT nasal spray   Nasal   Place 2 sprays into the nose daily.   16 g   2   . methylPREDNIsolone (MEDROL DOSPACK) 4 MG tablet      follow package directions   21 tablet   0   . methylPREDNIsolone (MEDROL DOSPACK) 4 MG tablet  follow package directions   21 tablet   0    BP 122/82  Pulse 113  Temp(Src) 100.1 F (37.8 C) (Oral)  Resp 18  SpO2 98%  LMP 09/18/2013 Physical Exam  Nursing note and vitals reviewed. Constitutional: She is oriented to person, place, and time. Vital signs are normal. She appears well-developed and well-nourished. No distress.  HENT:  Head: Normocephalic and atraumatic.  Right Ear: External ear normal.  Left Ear: External ear normal.  Nose: Right sinus exhibits maxillary sinus tenderness and frontal sinus tenderness. Left sinus exhibits maxillary sinus tenderness and frontal  sinus tenderness.  Mouth/Throat: Oropharynx is clear and moist. No oropharyngeal exudate.  Neck: Normal range of motion. Neck supple.  Cardiovascular: Normal rate, regular rhythm and normal heart sounds.  Exam reveals no gallop and no friction rub.   No murmur heard. Pulmonary/Chest: Effort normal and breath sounds normal. No respiratory distress. She has no wheezes. She has no rales.  Abdominal: Soft. There is no tenderness.  Lymphadenopathy:    She has no cervical adenopathy.  Neurological: She is alert and oriented to person, place, and time. She has normal strength. Coordination normal.  Skin: Skin is warm and dry. No rash noted. She is not diaphoretic.  Psychiatric: She has a normal mood and affect. Judgment normal.    ED Course  Procedures (including critical care time) Labs Review Labs Reviewed  CULTURE, GROUP A STREP  POCT RAPID STREP A ([facility] CARE ONLY)   Imaging Review No results found.    MDM   1. Acute sinusitis    Chest x-ray negative. Treat with Augmentin, Medrol Dosepak, Flonase. Followup if not beginning to improve after a couple days.   Meds ordered this encounter  Medications  . methylPREDNIsolone (MEDROL DOSPACK) 4 MG tablet    Sig: follow package directions    Dispense:  21 tablet    Refill:  0    Order Specific Question:  Supervising Provider    Answer:  Jake Michaelis, DAVID C [id number]  . amoxicillin-clavulanate (AUGMENTIN) 875-125 MG per tablet    Sig: Take 1 tablet by mouth every 12 (twelve) hours.    Dispense:  20 tablet    Refill:  0    Order Specific Question:  Supervising Provider    Answer:  Jake Michaelis, DAVID C [[id number]]      Liam Graham, PA-C 10/14/13 [phone]

## 2013-10-14 NOTE — ED Provider Notes (Signed)
Medical screening examination/treatment/procedure(s) were performed by non-physician practitioner and as supervising physician I was immediately available for consultation/collaboration.  Philipp Deputy, M.D.  Harden Mo, MD 10/14/13 [phone]

## 2013-10-14 NOTE — ED Notes (Signed)
C/o cold sx which started saturday States she has a cough, fever, sore throat, vomiting, headache and stuffy nose dayquil and mucinex taken but no relief.

## 2013-10-16 LAB — CULTURE, GROUP A STREP

## 2013-10-16 NOTE — ED Notes (Signed)
Some difficulty regarding her fluticisone Rx previously prescribed  . Authorized 1 unit of Rx, NR, 2 sprays. Left message on answering machine for 1 Rx, NR

## 2013-12-14 ENCOUNTER — Other Ambulatory Visit: Payer: Self-pay | Admitting: Internal Medicine

## 2014-04-30 LAB — TSH: TSH: 0.32 u[IU]/mL — AB (ref <=5.90)

## 2014-05-05 NOTE — Progress Notes (Signed)
   Subjective:    Patient ID: Teresa Blankenship, female    DOB: 07-May-1971, 43 y.o.   MRN: [id number]  HPI \not seen   Review of Systems     Objective:   Physical Exam        Assessment & Plan:

## 2014-08-11 ENCOUNTER — Encounter (HOSPITAL_COMMUNITY): Payer: Self-pay | Admitting: Emergency Medicine

## 2014-12-02 ENCOUNTER — Other Ambulatory Visit: Payer: Self-pay

## 2014-12-02 DIAGNOSIS — Z1231 Encounter for screening mammogram for malignant neoplasm of breast: Secondary | ICD-10-CM

## 2014-12-09 ENCOUNTER — Ambulatory Visit
Admission: RE | Admit: 2014-12-09 | Discharge: 2014-12-09 | Disposition: A | Discharge status: Home / Self Care | Payer: BLUE CROSS/BLUE SHIELD | Source: Ambulatory Visit

## 2014-12-09 DIAGNOSIS — Z1231 Encounter for screening mammogram for malignant neoplasm of breast: Secondary | ICD-10-CM

## 2015-01-06 ENCOUNTER — Emergency Department (HOSPITAL_COMMUNITY)
Admission: EM | Admit: 2015-01-06 | Discharge: 2015-01-06 | Disposition: A | Discharge status: Home / Self Care | Payer: BLUE CROSS/BLUE SHIELD | Source: Home / Self Care

## 2015-01-06 ENCOUNTER — Encounter (HOSPITAL_COMMUNITY): Payer: Self-pay | Admitting: Emergency Medicine

## 2015-01-06 DIAGNOSIS — J302 Other seasonal allergic rhinitis: Secondary | ICD-10-CM | POA: Diagnosis not present

## 2015-01-06 DIAGNOSIS — R059 Cough, unspecified: Secondary | ICD-10-CM

## 2015-01-06 DIAGNOSIS — R05 Cough: Secondary | ICD-10-CM | POA: Diagnosis not present

## 2015-01-06 MED ORDER — IPRATROPIUM BROMIDE 0.06 % NA SOLN: 2.0000 | Freq: Four times a day (QID) | NASAL | Status: DC

## 2015-01-06 MED ORDER — FLUTICASONE PROPIONATE 50 MCG/ACT NA SUSP: 2.0000 | Freq: Every day | NASAL | Status: DC

## 2015-01-06 MED ORDER — BENZONATATE 100 MG PO CAPS: 100.0000 mg | ORAL_CAPSULE | Freq: Three times a day (TID) | ORAL | Status: DC | PRN

## 2015-01-06 NOTE — ED Notes (Signed)
Sinus congestion, cough, runny nose, congestion, sinus drainage started Saturday.

## 2015-01-06 NOTE — ED Provider Notes (Signed)
CSN: [id number]     Arrival date & time 01/06/15  0804 History   None    Chief Complaint  Patient presents with  . URI   (Consider location/radiation/quality/duration/timing/severity/associated sxs/prior Treatment) HPI  4 day history of runny nose, congestion, cough. Has tried Sudafed and Mucinex DM with minimal benefit. He denies headache, fevers, chest pain, shortness breath, palpitations, dizziness, sore throat, rash. Symptoms are getting worse. Symptoms are constant.   Past Medical History  Diagnosis Date  . Allergy    Past Surgical History  Procedure Laterality Date  . Wisdom tooth extraction    . Shoulder surgery Left    Family History  Problem Relation Age of Onset  . Cancer Mother   . Diabetes Mother   . Heart disease Father   . Hypertension Brother    History  Substance Use Topics  . Smoking status: Never Smoker   . Smokeless tobacco: Never Used  . Alcohol Use: 1.0 oz/week    2 Standard drinks or equivalent per week     Comment: occasional liquor   OB History    Gravida Para Term Preterm AB TAB SAB Ectopic Multiple Living   3 1 1  1   1  1      Review of Systems Per HPI with all other pertinent systems negative.   Allergies  Review of patient's allergies indicates no known allergies.  Home Medications   Prior to Admission medications   Medication Sig Start Date End Date Taking? Authorizing Provider  norgestimate-ethinyl estradiol (ORTHO-CYCLEN,SPRINTEC,PREVIFEM) 0.25-35 MG-MCG tablet Take 1 tablet by mouth daily.   Yes Historical Provider, MD  albuterol (PROVENTIL HFA;VENTOLIN HFA) 108 (90 BASE) MCG/ACT inhaler Inhale 1-2 puffs into the lungs every 6 (six) hours as needed for wheezing. Patient not taking: Reported on 01/06/2015 06/14/13   Janne Napoleon, NP   BP 140/87 mmHg  Pulse 79  Temp(Src) 100.1 F (37.8 C) (Oral)  Resp 12  SpO2 97%  LMP 12/17/2014 Physical Exam Physical Exam  Constitutional: oriented to person, place, and time. appears  well-developed and well-nourished. No distress.  HENT:  Pharyngeal cobblestoning right tonsil 2+ left tonsil 1+ without exudate. Minimal maxillary and frontal sinus tenderness to palpation. Bilateral TMs clear. Head: Normocephalic and atraumatic.  Eyes: EOMI. PERRL.  Neck: Normal range of motion.  Cardiovascular: RRR, no m/r/g, 2+ distal pulses,  Pulmonary/Chest: Effort normal and breath sounds normal. No respiratory distress.  Abdominal: Soft. Bowel sounds are normal. NonTTP, no distension.  Musculoskeletal: Normal range of motion. Non ttp, no effusion.  Neurological: alert and oriented to person, place, and time.  Skin: Skin is warm. No rash noted. non diaphoretic.  Psychiatric: normal mood and affect. behavior is normal. Judgment and thought content normal.   ED Course  Procedures (including critical care time) Labs Review Labs Reviewed - No data to display  Imaging Review No results found.   MDM   1. Seasonal allergies   2. Cough    Likely all allergic at this point in time without evidence of asthma flare or viral or bacterial infection. Start Zyrtec, Flonase, nasal Atrovent, ibuprofen, Tessalon, nasal saline Continue Sudafed and Mucinex Precautions given and all questions answered  Linna Darner, MD Family Medicine 01/06/2015, 9:01 AM    Waldemar Dickens, MD 01/06/15 [phone]

## 2015-01-06 NOTE — Discharge Instructions (Signed)
The majority of your symptoms are from seasonal allergies. This is a particularly difficult time of year for this condition. Please continue using the Mucinex and Sudafed. Please start using the nasal Flonase at night, nasal Atrovent during the day, Zyrtec daily, ibuprofen 400-600 mg every 6 hours, and copious amounts of nasal saline. Please use the Tessalon Perles for additional cough relief.

## 2015-06-24 LAB — HM PAP SMEAR
HM Pap smear: NORMAL
HM Pap smear: NORMAL

## 2015-06-29 ENCOUNTER — Encounter: Payer: Self-pay | Admitting: Internal Medicine

## 2015-06-29 NOTE — Addendum Note (Signed)
Addended by: Janith Lima on: 06/29/2015 09:25 AM   Modules accepted: Miquel Dunn

## 2015-12-17 LAB — HM MAMMOGRAPHY: HM MAMMO: NORMAL (ref 0–4)

## 2016-02-24 ENCOUNTER — Ambulatory Visit: Payer: BLUE CROSS/BLUE SHIELD | Admitting: Internal Medicine

## 2016-02-24 ENCOUNTER — Encounter: Payer: Self-pay | Admitting: Family

## 2016-02-24 ENCOUNTER — Ambulatory Visit (INDEPENDENT_AMBULATORY_CARE_PROVIDER_SITE_OTHER): Payer: BLUE CROSS/BLUE SHIELD | Admitting: Family

## 2016-02-24 VITALS — BP 120/84 | HR 80 | Temp 98.2°F | Ht 66.0 in | Wt 174.0 lb

## 2016-02-24 DIAGNOSIS — R030 Elevated blood-pressure reading, without diagnosis of hypertension: Secondary | ICD-10-CM | POA: Diagnosis not present

## 2016-02-24 DIAGNOSIS — R0981 Nasal congestion: Secondary | ICD-10-CM | POA: Diagnosis not present

## 2016-02-24 DIAGNOSIS — IMO0001 Reserved for inherently not codable concepts without codable children: Secondary | ICD-10-CM

## 2016-02-24 MED ORDER — GUAIFENESIN ER 600 MG PO TB12: 1200.0000 mg | ORAL_TABLET | Freq: Two times a day (BID) | ORAL | Status: DC | PRN

## 2016-02-24 MED ORDER — FLUTICASONE PROPIONATE 50 MCG/ACT NA SUSP: 2.0000 | Freq: Every day | NASAL | Status: DC

## 2016-02-24 NOTE — Patient Instructions (Addendum)
Start flonase and mucinex for sinus congestion. If dizziness doesn't resolve, come back and see [place] - likely from sinus congestion.   Stop decongestants...   Your blood pressure looks great now. Please continue to monitor it as you do. Please schedule annual physical with PCP.  If there is no improvement in your symptoms, or if there is any worsening of symptoms, or if you have any additional concerns, please return for re-evaluation; or, if we are closed, consider going to the Emergency Room for evaluation if symptoms urgent.  Hypertension Hypertension, commonly called high blood pressure, is when the force of blood pumping through your arteries is too strong. Your arteries are the blood vessels that carry blood from your heart throughout your body. A blood pressure reading consists of a higher number over a lower number, such as 110/72. The higher number (systolic) is the pressure inside your arteries when your heart pumps. The lower number (diastolic) is the pressure inside your arteries when your heart relaxes. Ideally you want your blood pressure below 120/80. Hypertension forces your heart to work harder to pump blood. Your arteries may become narrow or stiff. Having untreated or uncontrolled hypertension can cause heart attack, stroke, kidney disease, and other problems. RISK FACTORS Some risk factors for high blood pressure are controllable. Others are not.  Risk factors you cannot control include:   Race. You may be at higher risk if you are African American.  Age. Risk increases with age.  Gender. Men are at higher risk than women before age 31 years. After age 74, women are at higher risk than men. Risk factors you can control include:  Not getting enough exercise or physical activity.  Being overweight.  Getting too much fat, sugar, calories, or salt in your diet.  Drinking too much alcohol. SIGNS AND SYMPTOMS Hypertension does not usually cause signs or symptoms. Extremely  high blood pressure (hypertensive crisis) may cause headache, anxiety, shortness of breath, and nosebleed. DIAGNOSIS To check if you have hypertension, your health care provider will measure your blood pressure while you are seated, with your arm held at the level of your heart. It should be measured at least twice using the same arm. Certain conditions can cause a difference in blood pressure between your right and left arms. A blood pressure reading that is higher than normal on one occasion does not mean that you need treatment. If it is not clear whether you have high blood pressure, you may be asked to return on a different day to have your blood pressure checked again. Or, you may be asked to monitor your blood pressure at home for 1 or more weeks. TREATMENT Treating high blood pressure includes making lifestyle changes and possibly taking medicine. Living a healthy lifestyle can help lower high blood pressure. You may need to change some of your habits. Lifestyle changes may include:  Following the DASH diet. This diet is high in fruits, vegetables, and whole grains. It is low in salt, red meat, and added sugars.  Keep your sodium intake below 2,300 mg per day.  Getting at least 30-45 minutes of aerobic exercise at least 4 times per week.  Losing weight if necessary.  Not smoking.  Limiting alcoholic beverages.  Learning ways to reduce stress. Your health care provider may prescribe medicine if lifestyle changes are not enough to get your blood pressure under control, and if one of the following is true:  You are 33-88 years of age and your systolic blood pressure  is above 140.  You are 22 years of age or older, and your systolic blood pressure is above 150.  Your diastolic blood pressure is above 90.  You have diabetes, and your systolic blood pressure is over 607 or your diastolic blood pressure is over 90.  You have kidney disease and your blood pressure is above  140/90.  You have heart disease and your blood pressure is above 140/90. Your personal target blood pressure may vary depending on your medical conditions, your age, and other factors. HOME CARE INSTRUCTIONS  Have your blood pressure rechecked as directed by your health care provider.   Take medicines only as directed by your health care provider. Follow the directions carefully. Blood pressure medicines must be taken as prescribed. The medicine does not work as well when you skip doses. Skipping doses also puts you at risk for problems.  Do not smoke.   Monitor your blood pressure at home as directed by your health care provider. SEEK MEDICAL CARE IF:   You think you are having a reaction to medicines taken.  You have recurrent headaches or feel dizzy.  You have swelling in your ankles.  You have trouble with your vision. SEEK IMMEDIATE MEDICAL CARE IF:  You develop a severe headache or confusion.  You have unusual weakness, numbness, or feel faint.  You have severe chest or abdominal pain.  You vomit repeatedly.  You have trouble breathing. MAKE SURE YOU:   Understand these instructions.  Will watch your condition.  Will get help right away if you are not doing well or get worse.   This information is not intended to replace advice given to you by your health care provider. Make sure you discuss any questions you have with your health care provider.   Document Released: 09/26/2005 Document Revised: 02/10/2015 Document Reviewed: 07/19/2013 Elsevier Interactive Patient Education [employer].

## 2016-02-24 NOTE — Progress Notes (Signed)
Pre visit review using our clinic review tool, if applicable. No additional management support is needed unless otherwise documented below in the visit note. 

## 2016-02-24 NOTE — Progress Notes (Signed)
Subjective:    Patient ID: Teresa Blankenship, female    DOB: 10-07-1971, 45 y.o.   MRN: [id number]   Teresa Blankenship is a 45 y.o. female who presents today for an acute visit.    HPI Comments: Patient here for multiple complaints. Patient here for evaluation for elevated blood pressure and checked BP with wrist   Patient here for evaluation of sinus pressure for one week. Has been taking Tylenol with decongestant for sinus and allergy headache over the past week. His stream seasonal allergies.   Per chart review, Last time we saw this patient was 2013.  Sinus Problem This is a new problem. The current episode started in the past 7 days. The problem is unchanged. There has been no fever. Associated symptoms include headaches and sinus pressure. Pertinent negatives include no chills, congestion, coughing, ear pain, shortness of breath or sore throat. Treatments tried: decongestants- oral and nasal.  The treatment provided mild relief.   Past Medical History  Diagnosis Date  . Allergy    Allergies: Review of patient's allergies indicates no known allergies. Current Outpatient Prescriptions on File Prior to Visit  Medication Sig Dispense Refill  . albuterol (PROVENTIL HFA;VENTOLIN HFA) 108 (90 BASE) MCG/ACT inhaler Inhale 1-2 puffs into the lungs every 6 (six) hours as needed for wheezing. (Patient not taking: Reported on 01/06/2015) 1 Inhaler 0  . benzonatate (TESSALON PERLES) 100 MG capsule Take 1-2 capsules (100-200 mg total) by mouth 3 (three) times daily as needed for cough. 30 capsule 0  . fluticasone (FLONASE) 50 MCG/ACT nasal spray Place 2 sprays into both nostrils at bedtime. 16 g 0  . ipratropium (ATROVENT) 0.06 % nasal spray Place 2 sprays into both nostrils 4 (four) times daily. 15 mL 12  . norgestimate-ethinyl estradiol (ORTHO-CYCLEN,SPRINTEC,PREVIFEM) 0.25-35 MG-MCG tablet Take 1 tablet by mouth daily.     No current facility-administered medications on file prior to visit.     Social History  Substance Use Topics  . Smoking status: Never Smoker   . Smokeless tobacco: Never Used  . Alcohol Use: 1.0 oz/week    2 Standard drinks or equivalent per week     Comment: occasional liquor    Review of Systems  Constitutional: Negative for fever, chills and unexpected weight change.  HENT: Positive for sinus pressure. Negative for congestion, ear pain, hearing loss and sore throat.   Eyes: Negative for visual disturbance.  Respiratory: Negative for cough, shortness of breath and wheezing.   Cardiovascular: Negative for chest pain, palpitations and leg swelling.  Gastrointestinal: Negative for nausea and vomiting.  Musculoskeletal: Negative for myalgias and arthralgias.  Neurological: Positive for dizziness and headaches. Negative for syncope. Light-headedness: from allergies.      Objective:    BP 120/84 mmHg  Pulse 80  Temp(Src) 98.2 F (36.8 C) (Oral)  Ht 5' 6"  (1.676 m)  Wt 174 lb (78.926 kg)  BMI 28.10 kg/m2  SpO2 98%   Physical Exam  Constitutional: She appears well-developed and well-nourished.  HENT:  Head: Normocephalic and atraumatic.  Right Ear: Hearing, tympanic membrane, external ear and ear canal normal. No swelling or tenderness. Tympanic membrane is not erythematous and not bulging. No middle ear effusion.  Left Ear: Tympanic membrane, external ear and ear canal normal. No swelling or tenderness. Tympanic membrane is not erythematous and not bulging.  No middle ear effusion.  Nose: Nose normal. No rhinorrhea. Right sinus exhibits no maxillary sinus tenderness and no frontal sinus tenderness. Left sinus exhibits no  maxillary sinus tenderness and no frontal sinus tenderness.  Mouth/Throat: Uvula is midline, oropharynx is clear and moist and mucous membranes are normal. No posterior oropharyngeal edema or posterior oropharyngeal erythema.  Eyes: Conjunctivae, EOM and lids are normal. Pupils are equal, round, and reactive to light. Lids are  everted and swept, no foreign bodies found.  Normal fundus bilaterally   Cardiovascular: Normal rate, regular rhythm, normal heart sounds and normal pulses.   Pulmonary/Chest: Effort normal and breath sounds normal. She has no wheezes. She has no rhonchi. She has no rales.  Lymphadenopathy:       Head (right side): No submental, no submandibular, no tonsillar, no preauricular, no posterior auricular and no occipital adenopathy present.       Head (left side): No submental, no submandibular, no tonsillar, no preauricular, no posterior auricular and no occipital adenopathy present.    She has no cervical adenopathy.       Right cervical: No superficial cervical, no deep cervical and no posterior cervical adenopathy present.      Left cervical: No superficial cervical, no deep cervical and no posterior cervical adenopathy present.  Neurological: She is alert. She has normal strength. No cranial nerve deficit or sensory deficit. She displays a negative Romberg sign.  Reflex Scores:      Bicep reflexes are 2+ on the right side and 2+ on the left side.      Patellar reflexes are 2+ on the right side and 2+ on the left side. Grip equal and strong bilateral upper extremities. Gait strong and steady. Able to perform  finger-to-nose without difficulty.   Skin: Skin is warm and dry.  Psychiatric: She has a normal mood and affect. Her speech is normal and behavior is normal. Thought content normal.  Vitals reviewed.      Assessment & Plan:   1. Elevated blood pressure Normotensive At this time. 120/80. Suspect wrist blood pressure cuff was inaccurate. Patient has also been on decongestants, advised her to stop these at this time. No signs or symptoms support hypertensive urgency or emergency at this time. Normal physical exam  2. Sinus congestion Suspect sinus congestion from seasonal allergies. No evidence of bacterial infection at this time. Suspect dizziness is exacerbated by sinus congestion.  Patient and I agreed on conservative therapy at this time a trial of Flonase and Mucinex for symptoms.  I am having Teresa Blankenship maintain her albuterol, norgestimate-ethinyl estradiol, fluticasone, ipratropium, and benzonatate.   No orders of the defined types were placed in this encounter.     Start medications as prescribed and explained to patient on After Visit Summary ( AVS). Risks, benefits, and alternatives of the medications and treatment plan prescribed today were discussed, and patient expressed understanding.   Education regarding symptom management and diagnosis given to patient.   Follow-up:Plan follow-up as discussed or as needed if any worsening symptoms or change in condition. Advised to make annual wellness visit with PCP.  Continue to follow with Scarlette Calico, MD for routine health maintenance.   Bud Face and I agreed with plan.   Mable Paris, FNP

## 2016-03-02 ENCOUNTER — Encounter: Payer: Self-pay | Admitting: Family

## 2016-03-02 ENCOUNTER — Ambulatory Visit (INDEPENDENT_AMBULATORY_CARE_PROVIDER_SITE_OTHER): Payer: BLUE CROSS/BLUE SHIELD | Admitting: Family

## 2016-03-02 VITALS — BP 150/100 | HR 94 | Temp 98.8°F | Ht 66.0 in | Wt 182.1 lb

## 2016-03-02 DIAGNOSIS — J019 Acute sinusitis, unspecified: Secondary | ICD-10-CM | POA: Diagnosis not present

## 2016-03-02 DIAGNOSIS — J029 Acute pharyngitis, unspecified: Secondary | ICD-10-CM | POA: Diagnosis not present

## 2016-03-02 LAB — POCT RAPID STREP A (OFFICE): Rapid Strep A Screen: NEGATIVE

## 2016-03-02 MED ORDER — AMOXICILLIN 500 MG PO CAPS: 500.0000 mg | ORAL_CAPSULE | Freq: Two times a day (BID) | ORAL | Status: DC

## 2016-03-02 NOTE — Progress Notes (Signed)
Subjective:    Patient ID: Teresa Blankenship, female    DOB: 01/11/1971, 45 y.o.   MRN: [id number]   Teresa Blankenship is a 45 y.o. female who presents today for an acute visit.    HPI Comments: Patient here for reevaluation of sinus congestion. I saw patient in week ago in clinic and prescribed Flonase and Mucinex for viral versus allergic etiology of sinus congestion. She states her symptoms are unchanged.Son diagnosed with strep throat.  Sinus Problem This is a recurrent problem. The current episode started in the past 7 days. The problem is unchanged. There has been no fever. She is experiencing no pain. Associated symptoms include congestion, coughing and a sore throat. Pertinent negatives include no chills, headaches, shortness of breath or sinus pressure. Treatments tried: flonase, mucinex. The treatment provided mild relief.   Past Medical History  Diagnosis Date  . Allergy    Allergies: Review of patient's allergies indicates no known allergies. Current Outpatient Prescriptions on File Prior to Visit  Medication Sig Dispense Refill  . fluticasone (FLONASE) 50 MCG/ACT nasal spray Place 2 sprays into both nostrils daily. 16 g 2  . guaiFENesin (MUCINEX) 600 MG 12 hr tablet Take 2 tablets (1,200 mg total) by mouth 2 (two) times daily as needed for cough or to loosen phlegm. 60 tablet 1  . MONONESSA 0.25-35 MG-MCG tablet TK 1 T PO  D  11   No current facility-administered medications on file prior to visit.    Social History  Substance Use Topics  . Smoking status: Never Smoker   . Smokeless tobacco: Never Used  . Alcohol Use: 1.0 oz/week    2 Standard drinks or equivalent per week     Comment: occasional liquor    Review of Systems  Constitutional: Negative for fever and chills.  HENT: Positive for congestion, postnasal drip, rhinorrhea and sore throat. Negative for sinus pressure.   Respiratory: Positive for cough. Negative for shortness of breath and wheezing.     Cardiovascular: Negative for chest pain and palpitations.  Gastrointestinal: Negative for nausea and vomiting.  Neurological: Negative for headaches.      Objective:    BP 150/100 mmHg  Pulse 94  Temp(Src) 98.8 F (37.1 C) (Oral)  Ht 5' 6"  (1.676 m)  Wt 182 lb 2 oz (82.611 kg)  BMI 29.41 kg/m2  SpO2 98%   Physical Exam  Constitutional: She appears well-developed and well-nourished.  HENT:  Head: Normocephalic and atraumatic.  Right Ear: Hearing, tympanic membrane, external ear and ear canal normal. No drainage, swelling or tenderness. No foreign bodies. Tympanic membrane is not erythematous and not bulging. No middle ear effusion. No decreased hearing is noted.  Left Ear: Hearing, tympanic membrane, external ear and ear canal normal. No drainage, swelling or tenderness. No foreign bodies. Tympanic membrane is not erythematous and not bulging.  No middle ear effusion. No decreased hearing is noted.  Nose: Rhinorrhea present. Right sinus exhibits maxillary sinus tenderness and frontal sinus tenderness. Left sinus exhibits maxillary sinus tenderness and frontal sinus tenderness.  Mouth/Throat: Uvula is midline and mucous membranes are normal. Posterior oropharyngeal erythema present. No oropharyngeal exudate, posterior oropharyngeal edema or tonsillar abscesses.  Diffuse mild sinus tenderness.  Eyes: Conjunctivae are normal.  Cardiovascular: Regular rhythm, normal heart sounds and normal pulses.   Pulmonary/Chest: Effort normal and breath sounds normal. She has no wheezes. She has no rhonchi. She has no rales.  Lymphadenopathy:       Head (right side): No submental,  no submandibular, no tonsillar, no preauricular, no posterior auricular and no occipital adenopathy present.       Head (left side): No submental, no submandibular, no tonsillar, no preauricular, no posterior auricular and no occipital adenopathy present.    She has no cervical adenopathy.  Neurological: She is alert.   Skin: Skin is warm and dry.  Psychiatric: She has a normal mood and affect. Her speech is normal and behavior is normal. Thought content normal.  Vitals reviewed.      Assessment & Plan:   1. Sore throat Negative strep - POCT rapid strep A  2. Acute sinusitis Due to duration of symptoms and no improvement on Flonase and Mucinex, patient and I jointly decided to start amoxicillin.    I am having Ms. Halteman maintain her MONONESSA, fluticasone, guaiFENesin, multivitamin, vitamin C, and Biotin.   Meds ordered this encounter  Medications  . Multiple Vitamin (MULTIVITAMIN) tablet    Sig: Take by mouth daily.  . Ascorbic Acid (VITAMIN C) 1000 MG tablet    Sig: Take 1,000 mg by mouth daily.  . Biotin 1 MG CAPS    Sig: Take by mouth.     Start medications as prescribed and explained to patient on After Visit Summary ( AVS). Risks, benefits, and alternatives of the medications and treatment plan prescribed today were discussed, and patient expressed understanding.   Education regarding symptom management and diagnosis given to patient.   Follow-up:Plan follow-up and return precautions given if any worsening symptoms or change in condition.   Continue to follow with Scarlette Calico, MD for routine health maintenance.   Bud Face and I agreed with plan.   Mable Paris, FNP

## 2016-03-02 NOTE — Progress Notes (Signed)
Pre visit review using our clinic review tool, if applicable. No additional management support is needed unless otherwise documented below in the visit note. 

## 2016-03-02 NOTE — Patient Instructions (Signed)
Increase intake of clear fluids. Congestion is best treated by hydration, when mucus is wetter, it is thinner, less sticky, and easier to expel from the body, either through coughing up drainage, or by blowing your nose.   Get plenty of rest.   Use saline nasal drops and blow your nose frequently. Run a humidifier at night and elevate the head of the bed. Vicks Vapor rub will help with congestion and cough. Steam showers and sinus massage for congestion.   Use Acetaminophen or Ibuprofen as needed for fever or pain. Avoid second hand smoke. Even the smallest exposure will worsen symptoms.   Over the counter medications you can try include Delsym for cough, a decongestant for congestion, and Mucinex or Robitussin as an expectorant. Be sure to just get the plain Mucinex or Robitussin that just has one medication (Guaifenesen). We don't recommend the combination products. Note, be sure to drink two glasses of water with each dose of Mucinex as the medication will not work well without adequate hydration.   You can also try a teaspoon of honey to see if this will help reduce cough. Throat lozenges can sometimes be beneficial as well.    This illness will typically last 7 - 10 days.   Please follow up with our clinic if you develop a fever greater than 101 F, symptoms worsen, or do not resolve in the next week.

## 2016-06-27 LAB — HM PAP SMEAR

## 2016-08-11 ENCOUNTER — Other Ambulatory Visit: Payer: Self-pay | Admitting: Family

## 2016-08-11 DIAGNOSIS — R0981 Nasal congestion: Secondary | ICD-10-CM

## 2016-08-16 NOTE — Telephone Encounter (Signed)
Left msg on triage stating pharmacy sent request to have flonase refill, and they have not receive back. Called pt no answer LMOM per chart refill went to [facility]. Will refill 30 day must see Dr. Ronnald Ramp for future refills...Teresa Blankenship

## 2016-08-25 ENCOUNTER — Ambulatory Visit: Payer: BLUE CROSS/BLUE SHIELD | Admitting: Internal Medicine

## 2016-09-13 ENCOUNTER — Encounter: Payer: Self-pay | Admitting: Internal Medicine

## 2016-09-13 ENCOUNTER — Ambulatory Visit (INDEPENDENT_AMBULATORY_CARE_PROVIDER_SITE_OTHER): Payer: BLUE CROSS/BLUE SHIELD | Admitting: Internal Medicine

## 2016-09-13 VITALS — BP 152/100 | HR 93 | Temp 98.1°F | Resp 16 | Ht 66.0 in | Wt 188.5 lb

## 2016-09-13 DIAGNOSIS — I10 Essential (primary) hypertension: Secondary | ICD-10-CM | POA: Diagnosis not present

## 2016-09-13 DIAGNOSIS — Z0000 Encounter for general adult medical examination without abnormal findings: Secondary | ICD-10-CM | POA: Diagnosis not present

## 2016-09-13 DIAGNOSIS — J301 Allergic rhinitis due to pollen: Secondary | ICD-10-CM

## 2016-09-13 DIAGNOSIS — R739 Hyperglycemia, unspecified: Secondary | ICD-10-CM | POA: Insufficient documentation

## 2016-09-13 DIAGNOSIS — Z0001 Encounter for general adult medical examination with abnormal findings: Secondary | ICD-10-CM | POA: Insufficient documentation

## 2016-09-13 MED ORDER — FLUTICASONE PROPIONATE 50 MCG/ACT NA SUSP: 2.0000 | Freq: Every day | NASAL | 11 refills | Status: DC

## 2016-09-13 MED ORDER — CETIRIZINE HCL 10 MG PO TABS: 10.0000 mg | ORAL_TABLET | Freq: Every day | ORAL | 11 refills | Status: DC

## 2016-09-13 NOTE — Progress Notes (Signed)
Subjective:  Patient ID: Teresa Blankenship, female    DOB: November 04, 1970  Age: 45 y.o. MRN: [id number]  CC: Hypertension; Annual Exam; and Allergic Rhinitis    HPI Teresa Blankenship presents for a CPX and f/up.  She complains of nasal congestion, runny nose, and postnasal drip. She wants a refill on Flonase nasal spray. She has been taking Sudafed recently to control the symptoms.  She has hypertension but is not willing to take an antihypertensive. She denies headache, blurred vision, chest pain, shortness of breath, edema, or fatigue.  Outpatient Medications Prior to Visit  Medication Sig Dispense Refill  . Ascorbic Acid (VITAMIN C) 1000 MG tablet Take 1,000 mg by mouth daily.    . Biotin 1 MG CAPS Take by mouth.    . MONONESSA 0.25-35 MG-MCG tablet TK 1 T PO  D  11  . fluticasone (FLONASE) 50 MCG/ACT nasal spray Place 2 sprays into both nostrils daily. Overdue for yearly physical w/labs must see MD for future refills 16 g 0  . amoxicillin (AMOXIL) 500 MG capsule Take 1 capsule (500 mg total) by mouth 2 (two) times daily. 14 capsule 0  . guaiFENesin (MUCINEX) 600 MG 12 hr tablet Take 2 tablets (1,200 mg total) by mouth 2 (two) times daily as needed for cough or to loosen phlegm. 60 tablet 1  . Multiple Vitamin (MULTIVITAMIN) tablet Take by mouth daily.     No facility-administered medications prior to visit.     ROS Review of Systems  Constitutional: Negative.  Negative for diaphoresis, fatigue and unexpected weight change.  HENT: Positive for congestion, postnasal drip, rhinorrhea and sneezing. Negative for facial swelling, sinus pain, sinus pressure, sore throat and trouble swallowing.   Eyes: Negative for photophobia and visual disturbance.  Respiratory: Negative for cough, choking, chest tightness, shortness of breath and stridor.   Cardiovascular: Negative for chest pain, palpitations and leg swelling.  Gastrointestinal: Negative.  Negative for abdominal pain, constipation, diarrhea,  nausea and vomiting.  Endocrine: Negative.   Genitourinary: Negative.   Musculoskeletal: Negative.  Negative for back pain, myalgias and neck pain.  Skin: Negative.   Allergic/Immunologic: Negative.   Neurological: Negative.  Negative for dizziness, weakness and light-headedness.  Hematological: Negative.  Negative for adenopathy. Does not bruise/bleed easily.  Psychiatric/Behavioral: Negative.     Objective:  BP (!) 152/100 (BP Location: Left Arm, Patient Position: Sitting, Cuff Size: Normal)   Pulse 93   Temp 98.1 F (36.7 C) (Oral)   Resp 16   Ht 5' 6"  (1.676 m)   Wt 188 lb 8 oz (85.5 kg)   SpO2 98%   BMI 30.42 kg/m   BP Readings from Last 3 Encounters:  09/13/16 (!) 152/100  03/02/16 (!) 150/100  02/24/16 120/84    Wt Readings from Last 3 Encounters:  09/13/16 188 lb 8 oz (85.5 kg)  03/02/16 182 lb 2 oz (82.6 kg)  02/24/16 174 lb (78.9 kg)    Physical Exam  Lab Results  Component Value Date   WBC 15.0 (H) 08/03/2009   HGB 15.6 (H) 08/03/2009   HCT 46.0 08/03/2009   PLT 314 08/03/2009   GLUCOSE 119 (H) 08/03/2009   ALT 14 08/03/2009   AST 24 08/03/2009   NA 140 08/03/2009   K 4.0 08/03/2009   CL 103 08/03/2009   CREATININE 0.6 08/03/2009   BUN 8 08/03/2009   TSH 0.32 (A) 04/30/2014    No results found.  Assessment & Plan:   Teresa Blankenship was seen today  for hypertension, annual exam and allergic rhinitis .  Diagnoses and all orders for this visit:  Routine general medical examination at a health care facility- minimal exam completed due to her lack of cooperation, she refused to do lab work today, she refused vaccines today, she tells me she had a mammogram earlier this year but I have no records of it, she also tells me she had a Pap smear earlier this year but I have no records of that. Patient education material was given. -     Lipid panel; Future -     Comprehensive metabolic panel; Future -     CBC with Differential/Platelet; Future -     TSH;  Future  Hyperglycemia- I will order an A1c to see if she has developed type 2 diabetes mellitus but she is refusing lab work today. -     Hemoglobin A1c; Future  Chronic seasonal allergic rhinitis due to pollen -     fluticasone (FLONASE) 50 MCG/ACT nasal spray; Place 2 sprays into both nostrils daily. -     cetirizine (ZYRTEC) 10 MG tablet; Take 1 tablet (10 mg total) by mouth daily.  Essential hypertension- she has poorly controlled hypertension, I've asked her to stop taking Sudafed, she refused to do an EKG today to screen for LVH, she refuses to take an antihypertensive. I've asked return in 2-3 months to reconsider. If she refuses to take an antihypertensive and to cooperate with appropriate medical evaluation and testing then I will discharge her from my practice.   I have discontinued Teresa Blankenship's guaiFENesin, multivitamin, and amoxicillin. I have also changed her fluticasone. Additionally, I am having her start on cetirizine. Lastly, I am having her maintain her MONONESSA, vitamin C, and Biotin.  Meds ordered this encounter  Medications  . fluticasone (FLONASE) 50 MCG/ACT nasal spray    Sig: Place 2 sprays into both nostrils daily.    Dispense:  16 g    Refill:  11  . cetirizine (ZYRTEC) 10 MG tablet    Sig: Take 1 tablet (10 mg total) by mouth daily.    Dispense:  30 tablet    Refill:  11     Follow-up: Return in about 3 months (around 12/12/2016).  Scarlette Calico, MD

## 2016-09-13 NOTE — Progress Notes (Signed)
Pre visit review using our clinic review tool, if applicable. No additional management support is needed unless otherwise documented below in the visit note. 

## 2016-09-13 NOTE — Patient Instructions (Signed)
Hypertension Hypertension, commonly called high blood pressure, is when the force of blood pumping through your arteries is too strong. Your arteries are the blood vessels that carry blood from your heart throughout your body. A blood pressure reading consists of a higher number over a lower number, such as 110/72. The higher number (systolic) is the pressure inside your arteries when your heart pumps. The lower number (diastolic) is the pressure inside your arteries when your heart relaxes. Ideally you want your blood pressure below 120/80. Hypertension forces your heart to work harder to pump blood. Your arteries may become narrow or stiff. Having untreated or uncontrolled hypertension can cause heart attack, stroke, kidney disease, and other problems. What increases the risk? Some risk factors for high blood pressure are controllable. Others are not. Risk factors you cannot control include:  Race. You may be at higher risk if you are African American.  Age. Risk increases with age.  Gender. Men are at higher risk than women before age 71 years. After age 50, women are at higher risk than men. Risk factors you can control include:  Not getting enough exercise or physical activity.  Being overweight.  Getting too much fat, sugar, calories, or salt in your diet.  Drinking too much alcohol. What are the signs or symptoms? Hypertension does not usually cause signs or symptoms. Extremely high blood pressure (hypertensive crisis) may cause headache, anxiety, shortness of breath, and nosebleed. How is this diagnosed? To check if you have hypertension, your health care provider will measure your blood pressure while you are seated, with your arm held at the level of your heart. It should be measured at least twice using the same arm. Certain conditions can cause a difference in blood pressure between your right and left arms. A blood pressure reading that is higher than normal on one occasion does  not mean that you need treatment. If it is not clear whether you have high blood pressure, you may be asked to return on a different day to have your blood pressure checked again. Or, you may be asked to monitor your blood pressure at home for 1 or more weeks. How is this treated? Treating high blood pressure includes making lifestyle changes and possibly taking medicine. Living a healthy lifestyle can help lower high blood pressure. You may need to change some of your habits. Lifestyle changes may include:  Following the DASH diet. This diet is high in fruits, vegetables, and whole grains. It is low in salt, red meat, and added sugars.  Keep your sodium intake below 2,300 mg per day.  Getting at least 30-45 minutes of aerobic exercise at least 4 times per week.  Losing weight if necessary.  Not smoking.  Limiting alcoholic beverages.  Learning ways to reduce stress. Your health care provider may prescribe medicine if lifestyle changes are not enough to get your blood pressure under control, and if one of the following is true:  You are 27-56 years of age and your systolic blood pressure is above 140.  You are 14 years of age or older, and your systolic blood pressure is above 150.  Your diastolic blood pressure is above 90.  You have diabetes, and your systolic blood pressure is over 250 or your diastolic blood pressure is over 90.  You have kidney disease and your blood pressure is above 140/90.  You have heart disease and your blood pressure is above 140/90. Your personal target blood pressure may vary depending on your medical  conditions, your age, and other factors. Follow these instructions at home:  Have your blood pressure rechecked as directed by your health care provider.  Take medicines only as directed by your health care provider. Follow the directions carefully. Blood pressure medicines must be taken as prescribed. The medicine does not work as well when you skip  doses. Skipping doses also puts you at risk for problems.  Do not smoke.  Monitor your blood pressure at home as directed by your health care provider. Contact a health care provider if:  You think you are having a reaction to medicines taken.  You have recurrent headaches or feel dizzy.  You have swelling in your ankles.  You have trouble with your vision. Get help right away if:  You develop a severe headache or confusion.  You have unusual weakness, numbness, or feel faint.  You have severe chest or abdominal pain.  You vomit repeatedly.  You have trouble breathing. This information is not intended to replace advice given to you by your health care provider. Make sure you discuss any questions you have with your health care provider. Document Released: 09/26/2005 Document Revised: 03/03/2016 Document Reviewed: 07/19/2013 Elsevier Interactive Patient Education  2017 [employer].

## 2016-09-14 DIAGNOSIS — I10 Essential (primary) hypertension: Secondary | ICD-10-CM | POA: Insufficient documentation

## 2016-10-12 ENCOUNTER — Encounter (HOSPITAL_COMMUNITY): Payer: Self-pay | Admitting: *Deleted

## 2016-10-12 ENCOUNTER — Ambulatory Visit (HOSPITAL_COMMUNITY)
Admission: EM | Admit: 2016-10-12 | Discharge: 2016-10-12 | Disposition: A | Discharge status: Home / Self Care | Payer: BLUE CROSS/BLUE SHIELD | Attending: Family Medicine | Admitting: Family Medicine

## 2016-10-12 DIAGNOSIS — Z79899 Other long term (current) drug therapy: Secondary | ICD-10-CM | POA: Insufficient documentation

## 2016-10-12 DIAGNOSIS — R319 Hematuria, unspecified: Secondary | ICD-10-CM | POA: Insufficient documentation

## 2016-10-12 DIAGNOSIS — N390 Urinary tract infection, site not specified: Secondary | ICD-10-CM | POA: Diagnosis not present

## 2016-10-12 LAB — POCT URINALYSIS DIP (DEVICE)
BILIRUBIN URINE: NEGATIVE
Glucose, UA: NEGATIVE mg/dL
KETONES UR: NEGATIVE mg/dL
Nitrite: POSITIVE — AB
Protein, ur: 30 mg/dL — AB
SPECIFIC GRAVITY, URINE: 1.025 (ref 1.005–1.030)
Urobilinogen, UA: 0.2 mg/dL (ref 0.0–1.0)
pH: 6.5 (ref 5.0–8.0)

## 2016-10-12 LAB — URINALYSIS, ROUTINE W REFLEX MICROSCOPIC
Bilirubin Urine: NEGATIVE
Glucose, UA: NEGATIVE mg/dL
Ketones, ur: NEGATIVE mg/dL
Nitrite: POSITIVE — AB
Protein, ur: 30 mg/dL — AB
Specific Gravity, Urine: 1.025 (ref 1.005–1.030)
pH: 6 (ref 5.0–8.0)

## 2016-10-12 LAB — POCT PREGNANCY, URINE: PREG TEST UR: NEGATIVE

## 2016-10-12 MED ORDER — PHENAZOPYRIDINE HCL 200 MG PO TABS: 200.0000 mg | ORAL_TABLET | Freq: Three times a day (TID) | ORAL | 0 refills | Status: DC

## 2016-10-12 MED ORDER — CEPHALEXIN 500 MG PO CAPS: 500.0000 mg | ORAL_CAPSULE | Freq: Four times a day (QID) | ORAL | 0 refills | Status: DC

## 2016-10-12 NOTE — ED Triage Notes (Signed)
Pt  Reports  Low  abd  abd  Pain    Pain  When  She   Urinates      Started   yest    denys  Any  Other  Symptoms

## 2016-10-12 NOTE — ED Provider Notes (Signed)
CSN: [id number]     Arrival date & time 10/12/16  1326 History   None    Chief Complaint  Patient presents with  . Urinary Tract Infection   (Consider location/radiation/quality/duration/timing/severity/associated sxs/prior Treatment) Patient c/o dysuria and urinary frequency   The history is provided by the patient.  Urinary Tract Infection  Pain quality:  Aching and burning Pain severity:  Moderate Onset quality:  Sudden Duration:  2 days Timing:  Constant Progression:  Worsening Relieved by:  Nothing Worsened by:  Nothing Ineffective treatments:  None tried Associated symptoms: abdominal pain   Associated symptoms: no fever     Past Medical History:  Diagnosis Date  . Allergy    Past Surgical History:  Procedure Laterality Date  . SHOULDER SURGERY Left   . WISDOM TOOTH EXTRACTION     Family History  Problem Relation Age of Onset  . Cancer Mother   . Diabetes Mother   . Heart disease Father   . Hypertension Brother    Social History  Substance Use Topics  . Smoking status: Never Smoker  . Smokeless tobacco: Never Used  . Alcohol use 1.0 oz/week    2 Standard drinks or equivalent per week     Comment: occasional liquor   OB History    Gravida Para Term Preterm AB Living   3 1 1   1 1    SAB TAB Ectopic Multiple Live Births       1   1     Review of Systems  Constitutional: Positive for fatigue. Negative for fever.  Eyes: Negative.   Gastrointestinal: Positive for abdominal pain.  Genitourinary: Negative.   Allergic/Immunologic: Negative.   Neurological: Negative.     Allergies  Patient has no known allergies.  Home Medications   Prior to Admission medications   Medication Sig Start Date End Date Taking? Authorizing Provider  Ascorbic Acid (VITAMIN C) 1000 MG tablet Take 1,000 mg by mouth daily.    Historical Provider, MD  Biotin 1 MG CAPS Take by mouth.    Historical Provider, MD  cephALEXin (KEFLEX) 500 MG capsule Take 1 capsule (500 mg  total) by mouth 4 (four) times daily. 10/12/16   Lysbeth Penner, FNP  cetirizine (ZYRTEC) 10 MG tablet Take 1 tablet (10 mg total) by mouth daily. 09/13/16   Janith Lima, MD  fluticasone (FLONASE) 50 MCG/ACT nasal spray Place 2 sprays into both nostrils daily. 09/13/16   Janith Lima, MD  MONONESSA 0.25-35 MG-MCG tablet TK 1 T PO  D 01/07/16   Historical Provider, MD  phenazopyridine (PYRIDIUM) 200 MG tablet Take 1 tablet (200 mg total) by mouth 3 (three) times daily. 10/12/16   Lysbeth Penner, FNP   Meds Ordered and Administered this Visit  Medications - No data to display  BP 132/84 (BP Location: Left Arm)   Pulse 80   Temp 99.4 F (37.4 C) (Oral)   Resp 18   LMP 10/12/2016   SpO2 99%  No data found.   Physical Exam  Constitutional: She appears well-developed and well-nourished.  HENT:  Head: Normocephalic and atraumatic.  Eyes: Conjunctivae and EOM are normal. Pupils are equal, round, and reactive to light.  Neck: Normal range of motion. Neck supple.  Cardiovascular: Normal rate, regular rhythm and normal heart sounds.   Pulmonary/Chest: Effort normal and breath sounds normal.  Abdominal: Soft. Bowel sounds are normal.  Nursing note and vitals reviewed.   Urgent Care Course   Clinical Course  Procedures (including critical care time)  Labs Review Labs Reviewed  POCT URINALYSIS DIP (DEVICE) - Abnormal; Notable for the following:       Result Value   Hgb urine dipstick MODERATE (*)    Protein, ur 30 (*)    Nitrite POSITIVE (*)    Leukocytes, UA TRACE (*)    All other components within normal limits  URINALYSIS, ROUTINE W REFLEX MICROSCOPIC  POCT PREGNANCY, URINE    Imaging Review No results found.   Visual Acuity Review  Right Eye Distance:   Left Eye Distance:   Bilateral Distance:    Right Eye Near:   Left Eye Near:    Bilateral Near:         MDM   1. Urinary tract infection with hematuria, site unspecified    Keflex 525m one po qid x  7 days #28 Pyridium 2024mone po tid x 2 days #6  Push po fluids, rest, tylenol and motrin otc prn as directed for fever, arthralgias, and myalgias.  Follow up prn if sx's continue or persist.    WiLysbeth PennerFNP 10/12/16 1540

## 2017-02-14 ENCOUNTER — Ambulatory Visit (HOSPITAL_COMMUNITY)
Admission: EM | Admit: 2017-02-14 | Discharge: 2017-02-14 | Disposition: A | Discharge status: Home / Self Care | Payer: BLUE CROSS/BLUE SHIELD | Attending: Internal Medicine | Admitting: Internal Medicine

## 2017-02-14 ENCOUNTER — Encounter (HOSPITAL_COMMUNITY): Payer: Self-pay | Admitting: Emergency Medicine

## 2017-02-14 DIAGNOSIS — J301 Allergic rhinitis due to pollen: Secondary | ICD-10-CM | POA: Diagnosis not present

## 2017-02-14 MED ORDER — PREDNISONE 10 MG (21) PO TBPK: ORAL_TABLET | ORAL | 0 refills | Status: DC

## 2017-02-14 NOTE — ED Triage Notes (Signed)
Pt has been suffering from allergies since Saturday.  Nasal congestion, watery eyes, headache, cough, and mild sore throat.  She has been taking prescribed and OTC medications with little relief.

## 2017-02-14 NOTE — ED Provider Notes (Signed)
CSN: [id number]     Arrival date & time 02/14/17  1101 History   None    Chief Complaint  Patient presents with  . URI   (Consider location/radiation/quality/duration/timing/severity/associated sxs/prior Treatment) The history is provided by the patient. No language interpreter was used.  URI  Presenting symptoms: congestion, cough and rhinorrhea   Severity:  Moderate Onset quality:  Gradual Timing:  Constant Progression:  Worsening Relieved by:  Nothing Worsened by:  Nothing Ineffective treatments:  None tried Associated symptoms: no neck pain   Risk factors: no sick contacts   Pt complains of allergies and congestion. Pt complains of watery eyes Pt reports no relief with otc medications.  Past Medical History:  Diagnosis Date  . Allergy    Past Surgical History:  Procedure Laterality Date  . SHOULDER SURGERY Left   . WISDOM TOOTH EXTRACTION     Family History  Problem Relation Age of Onset  . Cancer Mother   . Diabetes Mother   . Heart disease Father   . Hypertension Brother    Social History  Substance Use Topics  . Smoking status: Never Smoker  . Smokeless tobacco: Never Used  . Alcohol use 1.0 oz/week    2 Standard drinks or equivalent per week     Comment: occasional liquor   OB History    Gravida Para Term Preterm AB Living   3 1 1   1 1    SAB TAB Ectopic Multiple Live Births       1   1     Review of Systems  HENT: Positive for congestion and rhinorrhea.   Respiratory: Positive for cough.   Musculoskeletal: Negative for neck pain.  All other systems reviewed and are negative.   Allergies  Patient has no known allergies.  Home Medications   Prior to Admission medications   Medication Sig Start Date End Date Taking? Authorizing Provider  cetirizine (ZYRTEC) 10 MG tablet Take 1 tablet (10 mg total) by mouth daily. 09/13/16  Yes Janith Lima, MD  fluticasone (FLONASE) 50 MCG/ACT nasal spray Place 2 sprays into both nostrils daily. 09/13/16  Yes  Janith Lima, MD  MONONESSA 0.25-35 MG-MCG tablet TK 1 T PO  D 01/07/16  Yes [provider]  Ascorbic Acid (VITAMIN C) 1000 MG tablet Take 1,000 mg by mouth daily.    [provider]  Biotin 1 MG CAPS Take by mouth.    [provider]  cephALEXin (KEFLEX) 500 MG capsule Take 1 capsule (500 mg total) by mouth 4 (four) times daily. 10/12/16   Lysbeth Penner, FNP  phenazopyridine (PYRIDIUM) 200 MG tablet Take 1 tablet (200 mg total) by mouth 3 (three) times daily. 10/12/16   Lysbeth Penner, FNP   Meds Ordered and Administered this Visit  Medications - No data to display  BP (!) 169/89 (BP Location: Right Arm)   Pulse 84   Temp 98.7 F (37.1 C) (Oral)   LMP 02/09/2017 (Exact Date)   SpO2 99%  No data found.   Physical Exam  Constitutional: She appears well-developed and well-nourished. No distress.  HENT:  Head: Normocephalic and atraumatic.  Right Ear: External ear normal.  Left Ear: External ear normal.  Eyes: Conjunctivae are normal.  Injected bilat conjunctiva, tearing  Neck: Neck supple.  Cardiovascular: Normal rate and regular rhythm.   No murmur heard. Pulmonary/Chest: Effort normal and breath sounds normal. No respiratory distress.  Abdominal: Soft. There is no tenderness.  Musculoskeletal: She exhibits  no edema.  Neurological: She is alert.  Skin: Skin is warm and dry.  Psychiatric: She has a normal mood and affect.  Nursing note and vitals reviewed.   Urgent Care Course     Procedures (including critical care time)  Labs Review Labs Reviewed - No data to display  Imaging Review No results found.   Visual Acuity Review  Right Eye Distance:   Left Eye Distance:   Bilateral Distance:    Right Eye Near:   Left Eye Near:    Bilateral Near:         MDM   1. Seasonal allergic rhinitis due to pollen    An After Visit Summary was printed and given to the patient. Meds ordered this encounter  Medications  . predniSONE  (STERAPRED UNI-PAK 21 TAB) 10 MG (21) TBPK tablet    Sig: 6,5,4,3,2,1 taper    Dispense:  21 tablet    Refill:  0    Order Specific Question:   Supervising Provider    Answer:   Sherlene Shams [[id number]]     Fransico Meadow, PA-C 02/14/17 1305

## 2017-04-07 ENCOUNTER — Ambulatory Visit (INDEPENDENT_AMBULATORY_CARE_PROVIDER_SITE_OTHER): Payer: BLUE CROSS/BLUE SHIELD | Admitting: Internal Medicine

## 2017-04-07 ENCOUNTER — Encounter: Payer: Self-pay | Admitting: Internal Medicine

## 2017-04-07 VITALS — BP 150/82 | HR 108 | Temp 98.2°F | Resp 16 | Wt 190.0 lb

## 2017-04-07 DIAGNOSIS — M62838 Other muscle spasm: Secondary | ICD-10-CM | POA: Diagnosis not present

## 2017-04-07 MED ORDER — METHOCARBAMOL 500 MG PO TABS: 500.0000 mg | ORAL_TABLET | Freq: Four times a day (QID) | ORAL | 1 refills | Status: DC

## 2017-04-07 MED ORDER — PREDNISONE 20 MG PO TABS: 40.0000 mg | ORAL_TABLET | Freq: Every day | ORAL | 0 refills | Status: DC

## 2017-04-07 NOTE — Assessment & Plan Note (Addendum)
Left trapezius spasm, severe in nature She doesn't drive so do not want to give her any injection Start prednisone 40 mg daily for 5 days-okay to start NSAIDs after she finishes the prednisone. Take both with food Methocarbamol 4 times daily-she would like something that doesn't make her too drowsy Continue heat, try ice, okay to use topical treatments Call if no improvement-can consider physical therapy

## 2017-04-07 NOTE — Progress Notes (Signed)
Subjective:    Patient ID: Teresa Blankenship, female    DOB: 06-16-1971, 46 y.o.   MRN: [id number]  HPI She is here for an acute visit.   Left shoulder pain:  She rotator cuff surgery three years ago.  She started having pain in her left upper back, neck and shoulder area 3 days ago. She does work at a [employer] and is unsure if she did something there, but denies any injury or accident. The pain is constant. The pain starts in the upper left back and she has in her neck as well. She can feel pain radiating slightly to the upper arm. She denies any pain in the actual shoulder joint and is able to move the shoulder with minimal pain. She denies any numbness or tingling in the arm. She denies any weakness in the arm. There is no obvious swelling in the shoulder.  She has tried heat, Aleve and a topical spray-may have helped slightly, but her pain is severe.     Medications and allergies reviewed with patient and updated if appropriate.  Patient Active Problem List   Diagnosis Date Noted  . Essential hypertension 09/14/2016  . Routine general medical examination at a health care facility 09/13/2016  . Hyperglycemia 09/13/2016  . Allergic rhinitis 01/20/2012    Current Outpatient Prescriptions on File Prior to Visit  Medication Sig Dispense Refill  . Ascorbic Acid (VITAMIN C) 1000 MG tablet Take 1,000 mg by mouth daily.    . Biotin 1 MG CAPS Take by mouth.    . cetirizine (ZYRTEC) 10 MG tablet Take 1 tablet (10 mg total) by mouth daily. 30 tablet 11  . fluticasone (FLONASE) 50 MCG/ACT nasal spray Place 2 sprays into both nostrils daily. 16 g 11  . MONONESSA 0.25-35 MG-MCG tablet TK 1 T PO  D  11  . phenazopyridine (PYRIDIUM) 200 MG tablet Take 1 tablet (200 mg total) by mouth 3 (three) times daily. 6 tablet 0   No current facility-administered medications on file prior to visit.     Past Medical History:  Diagnosis Date  . Allergy     Past Surgical History:    Procedure Laterality Date  . SHOULDER SURGERY Left   . WISDOM TOOTH EXTRACTION      Social History   Social History  . Marital status: Married    Spouse name: N/A  . Number of children: N/A  . Years of education: N/A   Social History Main Topics  . Smoking status: Never Smoker  . Smokeless tobacco: Never Used  . Alcohol use 1.0 oz/week    2 Standard drinks or equivalent per week     Comment: occasional liquor  . Drug use: Yes    Types: Marijuana  . Sexual activity: Yes    Birth control/ protection: Condom, Pill   Other Topics Concern  . Not on file   Social History Narrative  . No narrative on file    Family History  Problem Relation Age of Onset  . Cancer Mother   . Diabetes Mother   . Heart disease Father   . Hypertension Brother     Review of Systems  Musculoskeletal: Positive for arthralgias, back pain, myalgias, neck pain and neck stiffness. Negative for joint swelling.  Neurological: Negative for dizziness, weakness, light-headedness, numbness and headaches.       Objective:   Vitals:   04/07/17 1556  BP: (!) 150/82  Pulse: (!) 108  Resp: 16  Temp: 98.2 F (36.8 C)   Filed Weights   04/07/17 1556  Weight: 190 lb (86.2 kg)   Body mass index is 30.67 kg/m.  Wt Readings from Last 3 Encounters:  04/07/17 190 lb (86.2 kg)  09/13/16 188 lb 8 oz (85.5 kg)  03/02/16 182 lb 2 oz (82.6 kg)     Physical Exam  Constitutional: She appears well-developed and well-nourished. No distress.  Musculoskeletal:  Decreased range of motion of neck, tenderness with palpation left side of neck and left trapezius muscle from shoulder to skull to mid back-muscle is tight/spasm. Full range of motion of left shoulder with no pain in shoulder joint with palpation. No shoulder effusion or swelling. No left upper arm pain with palpation  Neurological:  Normal strength and sensation bilateral upper extremities  Skin: Skin is warm and dry. No rash noted. She is not  diaphoretic. No erythema.          Assessment & Plan:   See Problem List for Assessment and Plan of chronic medical problems.

## 2017-04-07 NOTE — Patient Instructions (Signed)
Start the prednisone today and take as directed.   Take the muscle relaxer as prescribed.  Continue heat, ice.  You can take tylenol if needed.    You can start aleve/ advil after finishing the prednisone.   Consider physical therapy.   Call if no improvement    Muscle Cramps and Spasms Muscle cramps and spasms occur when a muscle or muscles tighten and you have no control over this tightening (involuntary muscle contraction). They are a common problem and can develop in any muscle. The most common place is in the calf muscles of the leg. Muscle cramps and muscle spasms are both involuntary muscle contractions, but there are some differences between the two:  Muscle cramps are painful. They come and go and may last a few seconds to 15 minutes. Muscle cramps are often more forceful and last longer than muscle spasms.  Muscle spasms may or may not be painful. They may also last just a few seconds or much longer.  Certain medical conditions, such as diabetes or Parkinson disease, can make it more likely to develop cramps or spasms. However, cramps or spasms are usually not caused by a serious underlying problem. Common causes include:  Overexertion.  Overuse from repetitive motions, or doing the same thing over and over.  Remaining in a certain position for a long period of time.  Improper preparation, form, or technique while playing a sport or doing an activity.  Dehydration.  Injury.  Side effects of some medicines.  Abnormally low levels of the salts and ions in your blood (electrolytes), especially potassium and calcium. This could happen if you are taking water pills (diuretics) or if you are pregnant.  In many cases, the cause of muscle cramps or spasms is unknown. Follow these instructions at home:  Stay well hydrated. Drink enough fluid to keep your urine clear or pale yellow.  Try massaging, stretching, and relaxing the affected muscle.  If directed, apply heat  to tight or tense muscles as often as told by your health care provider. Use the heat source that your health care provider recommends, such as a moist heat pack or a heating pad. ? Place a towel between your skin and the heat source. ? Leave the heat on for 20-30 minutes. ? Remove the heat if your skin turns bright red. This is especially important if you are unable to feel pain, heat, or cold. You may have a greater risk of getting burned.  If directed, put ice on the affected area. This may help if you are sore or have pain after a cramp or spasm. ? Put ice in a plastic bag. ? Place a towel between your skin and the bag. ? Leavethe ice on for 20 minutes, 2-3 times a day.  Take over-the-counter and prescription medicines only as told by your health care provider.  Pay attention to any changes in your symptoms. Contact a health care provider if:  Your cramps or spasms get more severe or happen more often.  Your cramps or spasms do not improve over time. This information is not intended to replace advice given to you by your health care provider. Make sure you discuss any questions you have with your health care provider. Document Released: 03/18/2002 Document Revised: 10/28/2015 Document Reviewed: 06/30/2015 Elsevier Interactive Patient Education  2018 [employer].

## 2017-04-11 ENCOUNTER — Telehealth: Payer: Self-pay | Admitting: Internal Medicine

## 2017-04-11 DIAGNOSIS — M25512 Pain in left shoulder: Secondary | ICD-10-CM

## 2017-04-11 DIAGNOSIS — M62838 Other muscle spasm: Secondary | ICD-10-CM

## 2017-04-11 NOTE — Telephone Encounter (Signed)
Pt would like referral sent to Sports Med, please route message back to [facility] so she can schedule appt.

## 2017-04-11 NOTE — Telephone Encounter (Signed)
Appointment scheduled with Dr Tamala Julian.

## 2017-04-11 NOTE — Telephone Encounter (Signed)
I would recommend PT and seeing sports medicine - I can refer to one or both - see what she wants to do

## 2017-04-11 NOTE — Telephone Encounter (Signed)
This pt was seen by Dr Quay Burow on 06/29 for left shoulder pain. She said that the pain has not gotten better and wanted to know what she needed to do.

## 2017-04-11 NOTE — Telephone Encounter (Signed)
ordered

## 2017-04-13 NOTE — Telephone Encounter (Signed)
Pt called saying that she would like a referral sent to another provider because she can not wait until her appointment with Dr Tamala Julian. Is there anyone else that you would recommend? I told her about Dr Paulla Fore but she was not familiar with where they are located.

## 2017-04-13 NOTE — Telephone Encounter (Signed)
Gave pt MD response. She said she would call around and let us know where the referral should be sent so that she can be seen sooner.

## 2017-04-13 NOTE — Telephone Encounter (Signed)
There are a couple of other sports medicine doctors in the system such as Dr Stefanie Libel - not sure when he would be able to see her.  I am not sure if ortho would be able to see her sooner.  She can try calling a couple of there orthopedics and see if they can get her in sooner

## 2017-04-26 ENCOUNTER — Ambulatory Visit (INDEPENDENT_AMBULATORY_CARE_PROVIDER_SITE_OTHER): Payer: BLUE CROSS/BLUE SHIELD | Admitting: Orthopedic Surgery

## 2017-04-26 ENCOUNTER — Ambulatory Visit (INDEPENDENT_AMBULATORY_CARE_PROVIDER_SITE_OTHER): Payer: BLUE CROSS/BLUE SHIELD

## 2017-04-26 ENCOUNTER — Encounter (INDEPENDENT_AMBULATORY_CARE_PROVIDER_SITE_OTHER): Payer: Self-pay | Admitting: Orthopedic Surgery

## 2017-04-26 DIAGNOSIS — M542 Cervicalgia: Secondary | ICD-10-CM

## 2017-04-26 DIAGNOSIS — M25512 Pain in left shoulder: Secondary | ICD-10-CM

## 2017-04-26 MED ORDER — BACLOFEN 5 MG PO TABS: 10.0000 mg | ORAL_TABLET | Freq: Three times a day (TID) | ORAL | 0 refills | Status: DC

## 2017-04-26 MED ORDER — IBUPROFEN-FAMOTIDINE 800-26.6 MG PO TABS: 1.0000 | ORAL_TABLET | Freq: Two times a day (BID) | ORAL | 0 refills | Status: DC

## 2017-04-29 NOTE — Progress Notes (Signed)
Office Visit Note   Patient: Teresa Blankenship           Date of Birth: April 20, 1971           MRN: [id number] Visit Date: 04/26/2017 Requested by: Janith Lima, MD [address] [place], [place] [address] PCP: Janith Lima, MD  Subjective: Chief Complaint  Patient presents with  . Left Shoulder - Pain  . Neck - Pain    HPI: Patient is a 46 year old patient with left shoulder pain.  She's had previous shoulder surgery done in 2015 which was subacromial decompression and distal clavicle excision.  Denies any recurrent injury.  She has sudden onset of pain about 3-4 weeks ago.  She states the pain is constant she is right-hand dominant.  Describes radicular type pain with some numbness and tingling in digits one and 2 of her left hand.  The pain is waking her from sleep at night.  She tried Biofreeze without relief.  She reports neck pain on the left-hand side and describes decreased motion in her neck.  Occasional decreased range of motion in the shoulder is noted as well.  Primary care provider gave her prednisone and muscle relaxer without relief.  She works at a [employer].  She states that at times she feels her heart beat in her arm.  When she extends her neck the pain does radiate down the left arm.              ROS: All systems reviewed are negative as they relate to the chief complaint within the history of present illness.  Patient denies  fevers or chills.   Assessment & Plan: Visit Diagnoses:  1. Neck pain   2. Acute pain of left shoulder     Plan: Impression is neck and left shoulder pain which is primarily radicular in nature.  Difficult clinical picture due to her prior surgery.  I think it's most likely though that the degenerative changes she has on plain radiographs as well as the nature of her symptoms that is likely coming from her neck.  No interval history of injury to the shoulder.  Plan is Duexis samples plus prescription.  Baclofen also  for muscle relaxation.  MRI cervical spine to evaluate left-sided radiculopathy.  If that is normal I would proceed with workup and treatment of the shoulder with injections and possible further imaging.  Rotator cuff strength is good on the shoulder.  Shoulder exam is relatively benign so I think radicular pain from the neck is most likely  Follow-Up Instructions: Return for after MRI.   Orders:  Orders Placed This Encounter  Procedures  . XR Cervical Spine 2 or 3 views  . XR Shoulder Left  . MR Cervical Spine w/o contrast   Meds ordered this encounter  Medications  . baclofen 5 MG TABS    Sig: Take 10 mg by mouth 3 (three) times daily.    Dispense:  30 tablet    Refill:  0  . Ibuprofen-Famotidine (DUEXIS) 800-26.6 MG TABS    Sig: Take 1 tablet by mouth 2 (two) times daily.    Dispense:  90 tablet    Refill:  0      Procedures: No procedures performed   Clinical Data: No additional findings.  Objective: Vital Signs: There were no vitals taken for this visit.  Physical Exam:   Constitutional: Patient appears well-developed HEENT:  Head: Normocephalic Eyes:EOM are normal Neck: Normal range of  motion Cardiovascular: Normal rate Pulmonary/chest: Effort normal Neurologic: Patient is alert Skin: Skin is warm Psychiatric: Patient has normal mood and affect    Ortho Exam: Orthopedic exam demonstrates full active and passive range of motion of the left shoulder with equivocal impingement signs and no discrete arthritis-type tenderness in the distal clavicle.  Rotator cuff strength is intact to infraspinatus supraspinatus and subscap muscle testing on the left-hand side.  Negative apprehension relocation testing.  Negative O'Brien's testing on the left.  A Schnidt has good cervical spine range of motion but some reproduction of symptoms noted with head extension.  Patient has 5 out of 5 grip EPL FPL interosseous wrist flexion-extension biceps triceps and deltoid strength but  does have some paresthesias in the C6 distribution left compared to the right.  Reflexes symmetric bilateral biceps triceps at 0-1+ out of 4.  No real course grinding or crepitus with active or passive range of motion of that left arm.  No other masses lymph adenopathy or skin changes noted in the shoulder girdle or neck region.  Specialty Comments:  No specialty comments available.  Imaging: No results found.   PMFS History: Patient Active Problem List   Diagnosis Date Noted  . Trapezius muscle spasm 04/07/2017  . Essential hypertension 09/14/2016  . Routine general medical examination at a health care facility 09/13/2016  . Hyperglycemia 09/13/2016  . Allergic rhinitis 01/20/2012   Past Medical History:  Diagnosis Date  . Allergy     Family History  Problem Relation Age of Onset  . Cancer Mother   . Diabetes Mother   . Heart disease Father   . Hypertension Brother     Past Surgical History:  Procedure Laterality Date  . SHOULDER SURGERY Left   . WISDOM TOOTH EXTRACTION     Social History   Occupational History  . Not on file.   Social History Main Topics  . Smoking status: Never Smoker  . Smokeless tobacco: Never Used  . Alcohol use 1.0 oz/week    2 Standard drinks or equivalent per week     Comment: occasional liquor  . Drug use: Yes    Types: Marijuana  . Sexual activity: Yes    Birth control/ protection: Condom, Pill

## 2017-05-04 ENCOUNTER — Ambulatory Visit: Payer: BLUE CROSS/BLUE SHIELD | Admitting: Family Medicine

## 2017-05-07 ENCOUNTER — Ambulatory Visit
Admission: RE | Admit: 2017-05-07 | Discharge: 2017-05-07 | Disposition: A | Discharge status: Home / Self Care | Payer: BLUE CROSS/BLUE SHIELD | Source: Ambulatory Visit | Attending: Orthopedic Surgery | Admitting: Orthopedic Surgery

## 2017-05-07 DIAGNOSIS — M542 Cervicalgia: Secondary | ICD-10-CM

## 2017-05-07 DIAGNOSIS — M25512 Pain in left shoulder: Secondary | ICD-10-CM

## 2017-05-11 ENCOUNTER — Encounter (INDEPENDENT_AMBULATORY_CARE_PROVIDER_SITE_OTHER): Payer: Self-pay | Admitting: Orthopedic Surgery

## 2017-05-11 ENCOUNTER — Ambulatory Visit (INDEPENDENT_AMBULATORY_CARE_PROVIDER_SITE_OTHER): Payer: BLUE CROSS/BLUE SHIELD | Admitting: Orthopedic Surgery

## 2017-05-11 DIAGNOSIS — M542 Cervicalgia: Secondary | ICD-10-CM | POA: Diagnosis not present

## 2017-05-11 MED ORDER — BACLOFEN 5 MG PO TABS: 10.0000 mg | ORAL_TABLET | Freq: Two times a day (BID) | ORAL | 0 refills | Status: DC

## 2017-05-11 NOTE — Progress Notes (Signed)
Office Visit Note   Patient: Teresa Blankenship           Date of Birth: 09-Nov-1970           MRN: [id number] Visit Date: 05/11/2017 Requested by: Janith Lima, MD [address], [place] [address] PCP: Janith Lima, MD  Subjective: Chief Complaint  Patient presents with  . Neck - Follow-up    HPI: Patient is a 46 year old with neck pain.  She works at a [employer] which involves lifting.  Since I seen her she has had an MRI scan which reveals significant disc bulging and osteophyte complex formation at C5-6 and C6-7 with both left and right-sided moderate to severe foraminal stenosis.  Scan is reviewed with the patient.  She is taking due axis and baclofen.  She reports all left arm symptoms and neck pain but no right arm symptoms              ROS: All systems reviewed are negative as they relate to the chief complaint within the history of present illness.  Patient denies  fevers or chills.   Assessment & Plan: Visit Diagnoses:  1. Neck pain     Plan: Impression is neck pain and left arm pain with no intractable pain and no weakness.  Plan is referral to Dr. Ernestina Patches for consideration of epidural steroid injections in the neck.  I think it is high likelihood she will require surgical referral.  I will refill her baclofen and see her back as needed.  If the shots don't work she is encouraged to call me so I can arrange surgical referral  Follow-Up Instructions: No Follow-up on file.   Orders:  Orders Placed This Encounter  Procedures  . Ambulatory referral to Physical Medicine Rehab   Meds ordered this encounter  Medications  . Baclofen 5 MG TABS    Sig: Take 10 mg by mouth 2 (two) times daily.    Dispense:  30 tablet    Refill:  0      Procedures: No procedures performed   Clinical Data: No additional findings.  Objective: Vital Signs: There were no vitals taken for this visit.  Physical Exam:   Constitutional: Patient appears  well-developed HEENT:  Head: Normocephalic Eyes:EOM are normal Neck: Normal range of motion Cardiovascular: Normal rate Pulmonary/chest: Effort normal Neurologic: Patient is alert Skin: Skin is warm Psychiatric: Patient has normal mood and affect    Ortho Exam: Orthopedic exam demonstrates full active and passive range of motion of both arms with 5 out of 5 grip EPL FPL interosseous wrist flexion and wrist extension biceps triceps and deltoid strength.  Radial pulses intact bilaterally.  No other masses lymph adenopathy or skin changes noted in the neck or arm region.  She reports some paresthesias in the fingertips and dorsal hands.  No muscle atrophy noted.  Reflexes symmetric  Specialty Comments:  No specialty comments available.  Imaging: No results found.   PMFS History: Patient Active Problem List   Diagnosis Date Noted  . Trapezius muscle spasm 04/07/2017  . Essential hypertension 09/14/2016  . Routine general medical examination at a health care facility 09/13/2016  . Hyperglycemia 09/13/2016  . Allergic rhinitis 01/20/2012   Past Medical History:  Diagnosis Date  . Allergy     Family History  Problem Relation Age of Onset  . Cancer Mother   . Diabetes Mother   . Heart disease Father   . Hypertension Brother  Past Surgical History:  Procedure Laterality Date  . SHOULDER SURGERY Left   . WISDOM TOOTH EXTRACTION     Social History   Occupational History  . Not on file.   Social History Main Topics  . Smoking status: Never Smoker  . Smokeless tobacco: Never Used  . Alcohol use 1.0 oz/week    2 Standard drinks or equivalent per week     Comment: occasional liquor  . Drug use: Yes    Types: Marijuana  . Sexual activity: Yes    Birth control/ protection: Condom, Pill

## 2017-05-31 ENCOUNTER — Ambulatory Visit (INDEPENDENT_AMBULATORY_CARE_PROVIDER_SITE_OTHER): Payer: BLUE CROSS/BLUE SHIELD | Admitting: Physical Medicine and Rehabilitation

## 2017-05-31 ENCOUNTER — Encounter (INDEPENDENT_AMBULATORY_CARE_PROVIDER_SITE_OTHER): Payer: Self-pay | Admitting: Physical Medicine and Rehabilitation

## 2017-05-31 VITALS — BP 136/87 | HR 85

## 2017-05-31 DIAGNOSIS — M5412 Radiculopathy, cervical region: Secondary | ICD-10-CM | POA: Diagnosis not present

## 2017-05-31 DIAGNOSIS — M25512 Pain in left shoulder: Secondary | ICD-10-CM | POA: Diagnosis not present

## 2017-05-31 DIAGNOSIS — G8929 Other chronic pain: Secondary | ICD-10-CM

## 2017-05-31 DIAGNOSIS — M542 Cervicalgia: Secondary | ICD-10-CM | POA: Diagnosis not present

## 2017-05-31 NOTE — Progress Notes (Deleted)
Neck and left shoulder pain for several months  No injury. Gotten worse over time. Pain down arm in to hand. Numbness and tingling down arm and into fingers. Decreased strength in arm. Denies any pain on right side.

## 2017-06-05 ENCOUNTER — Encounter (INDEPENDENT_AMBULATORY_CARE_PROVIDER_SITE_OTHER): Payer: Self-pay | Admitting: Physical Medicine and Rehabilitation

## 2017-06-05 NOTE — Progress Notes (Signed)
Teresa Blankenship - 46 y.o. female MRN [id number]  Date of birth: 04/29/1971  Office Visit Note: Visit Date: 05/31/2017 PCP: Janith Lima, MD Referred by: Janith Lima, MD  Subjective: Chief Complaint  Patient presents with  . Neck - Pain  . Left Shoulder - Pain  . Left Upper Arm - Pain, Numbness, Weakness   HPI: Teresa Blankenship is a 46 year old right-hand dominant female with several month history of chronic worsening neck pain with left shoulder pain which is somewhat posterior and lateral in the deltoid with referral pain down into the hand and somewhat of a more C6 or C7 distribution. She reports some numbness and tingling down the arm into the fingers and really it's all fingers at times but mainly the radial digits. She reports having some decreased arm strength and feels weakness overall. She denies any right-sided symptoms. She has had no focal weakness. She has had no specific trauma. She has a history of prior left shoulder surgery. She started seeing Dr. Marlou Sa in July. He has examined her shoulder at length and is felt like this is not an intrinsic shoulder issues and she is having radicular type pain down the arm. He did ultimately obtain an MRI of the cervical spine which is reviewed below and reviewed with the patient today. She has been using anti-inflammatories and muscle relaxers without much relief. She's had no prior cervical surgery. Symptoms can be worse with extension of the neck and looking up. She gets some relief with raising her arm. She feels like her arm is an aching throbbing pain at times.    Review of Systems  Constitutional: Negative for chills, fever, malaise/fatigue and weight loss.  HENT: Negative for hearing loss and sinus pain.   Eyes: Negative for blurred vision, double vision and photophobia.  Respiratory: Negative for cough and shortness of breath.   Cardiovascular: Negative for chest pain, palpitations and leg swelling.  Gastrointestinal: Negative for  abdominal pain, nausea and vomiting.  Genitourinary: Negative for flank pain.  Musculoskeletal: Positive for neck pain. Negative for myalgias.  Skin: Negative for itching and rash.  Neurological: Positive for tingling. Negative for tremors, focal weakness and weakness.  Endo/Heme/Allergies: Negative.   Psychiatric/Behavioral: Negative for depression.  All other systems reviewed and are negative.  Otherwise per HPI.  Assessment & Plan: Visit Diagnoses:  1. Cervicalgia   2. Chronic left shoulder pain   3. Cervical radiculopathy     Plan: Findings:  Chronic worsening severe left neck and arm pain with paresthesias consistent with radiculitis radiculopathy. Likely this seems to be more of a C6 or C7. MRI evidence of C5-6 moderate spondylosis and moderate bilateral foraminal narrowing and anterior cord impingement flattening. At C6-7 is more significant with right central disc protrusion as well as left more than right facet and uncovertebral joint hypertrophy with severe left foraminal stenosis. There is some right anterior cord flattening and moderate to severe central stenosis. It is fortunate she is not having more right-sided symptoms as well given the findings. I think this is probably somewhat of a surgical issue potentially. Physical exam is really nonfocal. I think she would do well at least with diagnostic and therapeutic cervical epidural injection. Depending on the results of that we would look at having Dr. Marlou Sa refer her to a spine surgeon versus electrodiagnostic study.    Meds & Orders: No orders of the defined types were placed in this encounter.  No orders of the defined types were placed in  this encounter.   Follow-up: Return for -T1 interlaminar epidural steroid injection.   Procedures: No procedures performed  No notes on file   Clinical History: MRI CERVICAL SPINE WITHOUT CONTRAST  TECHNIQUE: Multiplanar, multisequence MR imaging of the cervical spine  was performed. No intravenous contrast was administered.  COMPARISON:  04/26/2017 cervical spine radiographs.  FINDINGS: Alignment: Straightening of cervical lordosis without listhesis.  Vertebrae: No fracture, evidence of discitis, or bone lesion.  Cord: Normal signal and morphology.  Posterior Fossa, vertebral arteries, paraspinal tissues: Negative.  Disc levels:  C2-3: No significant disc displacement, foraminal narrowing, or canal stenosis.  C3-4: Small disc bulge eccentric to the left with mild left foraminal stenosis. No significant canal stenosis.  C4-5: Small disc osteophyte complex with prominent right-sided uncovertebral hypertrophy. Mild left and moderate to severe right foraminal stenosis. No significant canal stenosis.  C5-6: Moderate disc osteophyte complex with bilateral uncovertebral and facet hypertrophy. Moderate bilateral foraminal stenosis and mild canal stenosis with anterior cord impingement and flattening.  C6-7: Disc osteophyte complex with left-greater-than-right uncovertebral and facet hypertrophy. Moderate to severe left and mild right foraminal stenosis. Right central 6 mm disc protrusion impinges the right anterior cord with cord flattening and moderate to severe canal stenosis.  C7-T1: No significant disc displacement, foraminal narrowing, or canal stenosis.  IMPRESSION: 1. C6-7 right central disc protrusion with right anterior cord impingement, cord flattening, and moderate to severe canal stenosis. 2. Multiple levels of mild foraminal stenosis with moderate to severe right C4-5, moderate bilateral C5-6, and moderate to severe left C6-7 foraminal stenosis. 3. No acute osseous abnormality.  No abnormal cord signal.   Electronically Signed   By: Kristine Garbe M.D.   On: 05/08/2017 01:50  She reports that she has never smoked. She has never used smokeless tobacco. No results for input(s): HGBA1C, LABURIC in the  last 8760 hours.  Objective:  VS:  HT:    WT:   BMI:     BP:136/87  HR:85bpm  TEMP: ( )  RESP:  Physical Exam  Constitutional: She is oriented to person, place, and time. She appears well-developed and well-nourished.  Eyes: Pupils are equal, round, and reactive to light. Conjunctivae and EOM are normal.  Neck: No thyromegaly present.  Cardiovascular: Normal rate and intact distal pulses.   Pulmonary/Chest: Effort normal.  Musculoskeletal:  Cervical range of motion is limited in ranges of extension and rotation particularly to the left. She does have some active trigger points in the supraspinatus and teres minor and levator scapula. She does not have any real shoulder impingement signs. She has fairly good strength with long finger flexion and finger abduction and wrist extension. She has a negative Hoffmann's test bilaterally. She has symmetric reflexes at the biceps and brachioradialis somewhat diminished.  Lymphadenopathy:    She has no cervical adenopathy.  Neurological: She is alert and oriented to person, place, and time. She exhibits normal muscle tone.  Skin: Skin is warm and dry. No rash noted. No erythema.  Psychiatric: She has a normal mood and affect. Her behavior is normal.  Nursing note and vitals reviewed.   Ortho Exam Imaging: No results found.  Past Medical/Family/Surgical/Social History: Medications & Allergies reviewed per EMR Patient Active Problem List   Diagnosis Date Noted  . Trapezius muscle spasm 04/07/2017  . Essential hypertension 09/14/2016  . Routine general medical examination at a health care facility 09/13/2016  . Hyperglycemia 09/13/2016  . Allergic rhinitis 01/20/2012   Past Medical History:  Diagnosis Date  .  Allergy    Family History  Problem Relation Age of Onset  . Cancer Mother   . Diabetes Mother   . Heart disease Father   . Hypertension Brother    Past Surgical History:  Procedure Laterality Date  . SHOULDER SURGERY Left    . WISDOM TOOTH EXTRACTION     Social History   Occupational History  . Not on file.   Social History Main Topics  . Smoking status: Never Smoker  . Smokeless tobacco: Never Used  . Alcohol use 1.0 oz/week    2 Standard drinks or equivalent per week     Comment: occasional liquor  . Drug use: Yes    Types: Marijuana  . Sexual activity: Yes    Birth control/ protection: Condom, Pill

## 2017-06-08 ENCOUNTER — Encounter (INDEPENDENT_AMBULATORY_CARE_PROVIDER_SITE_OTHER): Payer: Self-pay | Admitting: Physical Medicine and Rehabilitation

## 2017-06-08 ENCOUNTER — Ambulatory Visit (INDEPENDENT_AMBULATORY_CARE_PROVIDER_SITE_OTHER): Payer: BLUE CROSS/BLUE SHIELD | Admitting: Physical Medicine and Rehabilitation

## 2017-06-08 ENCOUNTER — Ambulatory Visit (INDEPENDENT_AMBULATORY_CARE_PROVIDER_SITE_OTHER): Payer: BLUE CROSS/BLUE SHIELD

## 2017-06-08 VITALS — BP 158/97 | HR 95 | Temp 98.2°F

## 2017-06-08 DIAGNOSIS — M5412 Radiculopathy, cervical region: Secondary | ICD-10-CM | POA: Diagnosis not present

## 2017-06-08 MED ORDER — BETAMETHASONE SOD PHOS & ACET 6 (3-3) MG/ML IJ SUSP
12.0000 mg | Freq: Once | INTRAMUSCULAR | Status: AC
  Administered 2017-06-08: 12 mg

## 2017-06-08 MED ORDER — LIDOCAINE HCL 1 % IJ SOLN
2.0000 mL | Freq: Once | INTRAMUSCULAR | Status: AC
  Administered 2017-06-08: 2 mL

## 2017-06-08 NOTE — Progress Notes (Deleted)
Patient is here today for planned C7-T1 interlaminar injection. No change in symptoms.

## 2017-06-08 NOTE — Patient Instructions (Signed)

## 2017-06-09 NOTE — Procedures (Signed)
Teresa Blankenship is a 46 year old female who is here for planned C7-T1 interlaminar epidural steroid injection. Please see our prior evaluation and management note for further details and justification.  Cervical Epidural Steroid Injection - Interlaminar Approach with Fluoroscopic Guidance  Patient: Teresa Blankenship      Date of Birth: 1970/11/22 MRN: [id number] PCP: Janith Lima, MD      Visit Date: 06/08/2017   Universal Protocol:    Date/Time: 06/10/1811:19 PM  Consent Given By: the patient  Position: PRONE  Additional Comments: Vital signs were monitored before and after the procedure. Patient was prepped and draped in the usual sterile fashion. The correct patient, procedure, and site was verified.   Injection Procedure Details:  Procedure Site One Meds Administered:  Meds ordered this encounter  Medications  . lidocaine (XYLOCAINE) 1 % (with pres) injection 2 mL  . betamethasone acetate-betamethasone sodium phosphate (CELESTONE) injection 12 mg     Laterality: Bilateral  Location/Site:  C7-T1  Needle size: 20 G  Needle type: Touhy  Needle Placement: Paramedian epidural space  Findings:  -Contrast Used: 0.5 mL iohexol 180 mg iodine/mL   -Comments: Excellent flow of contrast into the epidural space.  Procedure Details: Using a paramedian approach from the side mentioned above, the region overlying the inferior lamina was localized under fluoroscopic visualization and the soft tissues overlying this structure were infiltrated with 4 ml. of 1% Lidocaine without Epinephrine. A # 20 gauge, Tuohy needle was inserted into the epidural space using a paramedian approach.  The epidural space was localized using loss of resistance along with lateral and contralateral oblique bi-planar fluoroscopic views.  After negative aspirate for air, blood, and CSF, a 2 ml. volume of Isovue-250 was injected into the epidural space and the flow of contrast was observed. Radiographs were  obtained for documentation purposes.   The injectate was administered into the level noted above.  Additional Comments:  The patient tolerated the procedure well Dressing: Band-Aid    Post-procedure details: Patient was observed during the procedure. Post-procedure instructions were reviewed.  Patient left the clinic in stable condition.

## 2017-08-29 ENCOUNTER — Other Ambulatory Visit: Payer: Self-pay | Admitting: Internal Medicine

## 2017-08-29 DIAGNOSIS — J301 Allergic rhinitis due to pollen: Secondary | ICD-10-CM

## 2017-09-13 ENCOUNTER — Other Ambulatory Visit: Payer: Self-pay | Admitting: Internal Medicine

## 2017-09-13 DIAGNOSIS — J301 Allergic rhinitis due to pollen: Secondary | ICD-10-CM

## 2017-10-25 DIAGNOSIS — Z760 Encounter for issue of repeat prescription: Secondary | ICD-10-CM | POA: Diagnosis not present

## 2017-10-25 DIAGNOSIS — Z124 Encounter for screening for malignant neoplasm of cervix: Secondary | ICD-10-CM | POA: Diagnosis not present

## 2017-10-25 DIAGNOSIS — Z01419 Encounter for gynecological examination (general) (routine) without abnormal findings: Secondary | ICD-10-CM | POA: Diagnosis not present

## 2017-10-25 DIAGNOSIS — Z1231 Encounter for screening mammogram for malignant neoplasm of breast: Secondary | ICD-10-CM | POA: Diagnosis not present

## 2017-11-01 ENCOUNTER — Encounter: Payer: Self-pay | Admitting: Internal Medicine

## 2017-11-05 ENCOUNTER — Other Ambulatory Visit: Payer: Self-pay | Admitting: Internal Medicine

## 2017-11-05 DIAGNOSIS — J301 Allergic rhinitis due to pollen: Secondary | ICD-10-CM

## 2017-11-10 ENCOUNTER — Ambulatory Visit: Payer: Self-pay | Admitting: *Deleted

## 2017-11-10 ENCOUNTER — Ambulatory Visit (INDEPENDENT_AMBULATORY_CARE_PROVIDER_SITE_OTHER): Payer: BLUE CROSS/BLUE SHIELD | Admitting: Family

## 2017-11-10 ENCOUNTER — Encounter: Payer: Self-pay | Admitting: Family

## 2017-11-10 VITALS — BP 150/80 | HR 84 | Temp 99.3°F | Ht 66.0 in | Wt 191.0 lb

## 2017-11-10 DIAGNOSIS — R030 Elevated blood-pressure reading, without diagnosis of hypertension: Secondary | ICD-10-CM | POA: Diagnosis not present

## 2017-11-10 DIAGNOSIS — J019 Acute sinusitis, unspecified: Secondary | ICD-10-CM | POA: Diagnosis not present

## 2017-11-10 MED ORDER — AMOXICILLIN-POT CLAVULANATE 875-125 MG PO TABS: 1.0000 | ORAL_TABLET | Freq: Two times a day (BID) | ORAL | 0 refills | Status: DC

## 2017-11-10 NOTE — Telephone Encounter (Signed)
Patient wants to know is she can take Sudafed with her antibiotic- Amoxicillin. Told patient she should be fine- did check with patient about BP medications and she states she does not take any. Also checked in Micromedex and no drug interaction.  Reason for Disposition . Caller has medication question only, adult not sick, and triager answers question  Protocols used: MEDICATION QUESTION CALL-A-AH

## 2017-11-10 NOTE — Progress Notes (Signed)
Teresa Blankenship is a 47 y.o. female with the following history as recorded in Epic[facility]:  Patient Active Problem List   Diagnosis Date Noted  . Trapezius muscle spasm 04/07/2017  . Essential hypertension 09/14/2016  . Routine general medical examination at a health care facility 09/13/2016  . Hyperglycemia 09/13/2016  . Allergic rhinitis 01/20/2012    Current Outpatient Medications  Medication Sig Dispense Refill  . amoxicillin-clavulanate (AUGMENTIN) 875-125 MG tablet Take 1 tablet by mouth 2 (two) times daily. 20 tablet 0  . Ascorbic Acid (VITAMIN C) 1000 MG tablet Take 1,000 mg by mouth daily.    . Biotin 1 MG CAPS Take by mouth.    . cetirizine (ZYRTEC) 10 MG tablet TAKE 1 TABLET(10 MG) BY MOUTH DAILY 90 tablet 1  . fluticasone (FLONASE) 50 MCG/ACT nasal spray INSTILL 2 SPRAYS IN EACH NOSTRIL DAILY. OFFICE VISIT NEEDED FOR FURTHER REFILLS 48 g 1  . Ibuprofen-Famotidine (DUEXIS) 800-26.6 MG TABS Take 1 tablet by mouth 2 (two) times daily. 90 tablet 0  . MONONESSA 0.25-35 MG-MCG tablet TK 1 T PO  D  11   No current facility-administered medications for this visit.     Allergies: Patient has no known allergies.  Past Medical History:  Diagnosis Date  . Allergy     Past Surgical History:  Procedure Laterality Date  . SHOULDER SURGERY Left   . WISDOM TOOTH EXTRACTION      Family History  Problem Relation Age of Onset  . Cancer Mother   . Diabetes Mother   . Heart disease Father   . Hypertension Brother     Social History   Tobacco Use  . Smoking status: Never Smoker  . Smokeless tobacco: Never Used  Substance Use Topics  . Alcohol use: Yes    Alcohol/week: 1.0 oz    Types: 2 Standard drinks or equivalent per week    Comment: occasional liquor    Subjective:  Patient presents with concerns for sinus infection; symptoms x 5 days; + sinus pressure/ frontal headache; "my teeth hurt." + low-grade fevers; no chest congestion/ shortness of breath; using OTC Zyrtec, Flonase  and Sudafed PE;  Objective:  Vitals:   11/10/17 0817  BP: (!) 150/80  Pulse: 84  Temp: 99.3 F (37.4 C)  TempSrc: Oral  SpO2: 99%  Weight: 191 lb (86.6 kg)  Height: 5' 6"  (1.676 m)    General: Well developed, well nourished, in no acute distress  Skin : Warm and dry.  Head: Normocephalic and atraumatic  Eyes: Sclera and conjunctiva clear; pupils round and reactive to light; extraocular movements intact  Ears: External normal; canals clear; tympanic membranes normal  Oropharynx: Pink, supple. No suspicious lesions  Neck: Supple without thyromegaly, adenopathy  Lungs: Respirations unlabored; clear to auscultation bilaterally without wheeze, rales, rhonchi  CVS exam: normal rate and regular rhythm.  Neurologic: Alert and oriented; speech intact; face symmetrical; moves all extremities well; CNII-XII intact without focal deficit   Assessment:  1. Acute sinusitis, recurrence not specified, unspecified location   2. Elevated blood pressure reading     Plan:  1. Rx for Augmentin 875 mg bid x 10 days; continue Flonase and Zyrtec; encouraged to try Sudafed as well; increase fluids, rest and follow-up worse, no better. 2. ? Illness related; she notes her blood pressure was normal at GYN 2 weeks ago; she is to monitor regularly and follow-up with her PCP if consistently above 140/90.  No Follow-up on file.  No orders of the defined  types were placed in this encounter.   Requested Prescriptions   Signed Prescriptions Disp Refills  . amoxicillin-clavulanate (AUGMENTIN) 875-125 MG tablet 20 tablet 0    Sig: Take 1 tablet by mouth 2 (two) times daily.

## 2017-11-10 NOTE — Patient Instructions (Signed)
Ask the pharmacist for "real" Sudafed; continue Zyrtec and Flonase;

## 2018-01-08 ENCOUNTER — Ambulatory Visit (INDEPENDENT_AMBULATORY_CARE_PROVIDER_SITE_OTHER): Payer: BLUE CROSS/BLUE SHIELD

## 2018-01-08 ENCOUNTER — Ambulatory Visit (INDEPENDENT_AMBULATORY_CARE_PROVIDER_SITE_OTHER): Payer: BLUE CROSS/BLUE SHIELD | Admitting: Podiatry

## 2018-01-08 VITALS — BP 156/94 | HR 83 | Ht 67.0 in | Wt 190.0 lb

## 2018-01-08 DIAGNOSIS — M722 Plantar fascial fibromatosis: Secondary | ICD-10-CM

## 2018-01-08 DIAGNOSIS — M7661 Achilles tendinitis, right leg: Secondary | ICD-10-CM

## 2018-01-08 DIAGNOSIS — N951 Menopausal and female climacteric states: Secondary | ICD-10-CM | POA: Insufficient documentation

## 2018-01-08 DIAGNOSIS — M7662 Achilles tendinitis, left leg: Secondary | ICD-10-CM | POA: Diagnosis not present

## 2018-01-08 MED ORDER — MELOXICAM 15 MG PO TABS: 15.0000 mg | ORAL_TABLET | Freq: Every day | ORAL | 1 refills | Status: AC

## 2018-01-08 MED ORDER — METHYLPREDNISOLONE 4 MG PO TBPK: ORAL_TABLET | ORAL | 0 refills | Status: DC

## 2018-01-08 NOTE — Progress Notes (Signed)
ref

## 2018-01-10 DIAGNOSIS — M25571 Pain in right ankle and joints of right foot: Secondary | ICD-10-CM | POA: Diagnosis not present

## 2018-01-10 DIAGNOSIS — M7661 Achilles tendinitis, right leg: Secondary | ICD-10-CM | POA: Diagnosis not present

## 2018-01-10 DIAGNOSIS — M7662 Achilles tendinitis, left leg: Secondary | ICD-10-CM | POA: Diagnosis not present

## 2018-01-10 DIAGNOSIS — M25572 Pain in left ankle and joints of left foot: Secondary | ICD-10-CM | POA: Diagnosis not present

## 2018-01-11 NOTE — Progress Notes (Signed)
   HPI: 47 year old female presenting today as a new patient with a chief complaint of pain to the bilateral heels that has been ongoing for several months. Walking and standing for long periods of time increases the pain. She has not had any treatment for her symptoms. Patient is here for further evaluation and treatment.   Past Medical History:  Diagnosis Date  . Allergy       Physical Exam: General: The patient is alert and oriented x3 in no acute distress.  Dermatology: Skin is warm, dry and supple bilateral lower extremities. Negative for open lesions or macerations.  Vascular: Palpable pedal pulses bilaterally. No edema or erythema noted. Capillary refill within normal limits.  Neurological: Epicritic and protective threshold grossly intact bilaterally.   Musculoskeletal Exam: Pain on palpation noted to the posterior tubercle of the bilateral calcaneus at the insertion of the Achilles tendon consistent with retrocalcaneal bursitis. Range of motion within normal limits. Muscle strength 5/5 in all muscle groups bilateral lower extremities.  Radiographic Exam:  Calcifications noted to the insertions of the bilateral achilles tendons at the superior posterior tubercle of the calcaneus. No fracture or dislocation noted.   Assessment: 1. Insertional Achilles tendinitis bilateral 2. Retrocalcaneal bursitis   Plan of Care:  1. Patient was evaluated. Radiographs were reviewed today. 2. Orders for physical therapy three time weekly for four weeks.  3. Prescription for Medrol Dose Pak provided for patient.  4. Prescription for Meloxicam provided to patient.  5. Recommended good shoe gear.  6. Return to clinic in 4 weeks.   Works at [employer].    Edrick Kins, DPM [facility]  Dr. Edrick Kins, [address]                                        [place], [place] [address]                Office [phone]  Fax [phone]

## 2018-01-24 DIAGNOSIS — M25571 Pain in right ankle and joints of right foot: Secondary | ICD-10-CM | POA: Diagnosis not present

## 2018-01-24 DIAGNOSIS — M7662 Achilles tendinitis, left leg: Secondary | ICD-10-CM | POA: Diagnosis not present

## 2018-01-24 DIAGNOSIS — M25572 Pain in left ankle and joints of left foot: Secondary | ICD-10-CM | POA: Diagnosis not present

## 2018-01-24 DIAGNOSIS — M7661 Achilles tendinitis, right leg: Secondary | ICD-10-CM | POA: Diagnosis not present

## 2018-02-05 ENCOUNTER — Ambulatory Visit: Payer: BLUE CROSS/BLUE SHIELD | Admitting: Podiatry

## 2018-03-20 DIAGNOSIS — M79672 Pain in left foot: Secondary | ICD-10-CM | POA: Diagnosis not present

## 2018-03-20 DIAGNOSIS — M7661 Achilles tendinitis, right leg: Secondary | ICD-10-CM | POA: Diagnosis not present

## 2018-03-20 DIAGNOSIS — M7662 Achilles tendinitis, left leg: Secondary | ICD-10-CM | POA: Diagnosis not present

## 2018-03-20 DIAGNOSIS — M79671 Pain in right foot: Secondary | ICD-10-CM | POA: Diagnosis not present

## 2018-05-14 ENCOUNTER — Other Ambulatory Visit: Payer: Self-pay | Admitting: Internal Medicine

## 2018-05-14 DIAGNOSIS — J301 Allergic rhinitis due to pollen: Secondary | ICD-10-CM

## 2018-06-27 ENCOUNTER — Other Ambulatory Visit (INDEPENDENT_AMBULATORY_CARE_PROVIDER_SITE_OTHER): Payer: BLUE CROSS/BLUE SHIELD

## 2018-06-27 ENCOUNTER — Encounter: Payer: Self-pay | Admitting: Internal Medicine

## 2018-06-27 ENCOUNTER — Ambulatory Visit (INDEPENDENT_AMBULATORY_CARE_PROVIDER_SITE_OTHER): Payer: BLUE CROSS/BLUE SHIELD | Admitting: Internal Medicine

## 2018-06-27 VITALS — BP 156/98 | HR 90 | Temp 98.4°F | Resp 16 | Ht 67.0 in | Wt 193.5 lb

## 2018-06-27 DIAGNOSIS — E559 Vitamin D deficiency, unspecified: Secondary | ICD-10-CM | POA: Diagnosis not present

## 2018-06-27 DIAGNOSIS — I10 Essential (primary) hypertension: Secondary | ICD-10-CM | POA: Diagnosis not present

## 2018-06-27 DIAGNOSIS — Z0000 Encounter for general adult medical examination without abnormal findings: Secondary | ICD-10-CM

## 2018-06-27 DIAGNOSIS — R739 Hyperglycemia, unspecified: Secondary | ICD-10-CM

## 2018-06-27 DIAGNOSIS — J301 Allergic rhinitis due to pollen: Secondary | ICD-10-CM

## 2018-06-27 DIAGNOSIS — Z23 Encounter for immunization: Secondary | ICD-10-CM | POA: Diagnosis not present

## 2018-06-27 LAB — CBC WITH DIFFERENTIAL/PLATELET
BASOS PCT: 1.2 % (ref 0.0–3.0)
Basophils Absolute: 0.1 10*3/uL (ref 0.0–0.1)
EOS PCT: 1.1 % (ref 0.0–5.0)
Eosinophils Absolute: 0.1 10*3/uL (ref 0.0–0.7)
HEMATOCRIT: 39.2 % (ref 36.0–46.0)
Hemoglobin: 13.2 g/dL (ref 12.0–15.0)
LYMPHS ABS: 3.1 10*3/uL (ref 0.7–4.0)
Lymphocytes Relative: 36 % (ref 12.0–46.0)
MCHC: 33.6 g/dL (ref 30.0–36.0)
MCV: 99.5 fl (ref 78.0–100.0)
MONO ABS: 0.6 10*3/uL (ref 0.1–1.0)
MONOS PCT: 6.9 % (ref 3.0–12.0)
NEUTROS PCT: 54.8 % (ref 43.0–77.0)
Neutro Abs: 4.8 10*3/uL (ref 1.4–7.7)
Platelets: 326 10*3/uL (ref 150.0–400.0)
RBC: 3.94 Mil/uL (ref 3.87–5.11)
RDW: 11.9 % (ref 11.5–15.5)
WBC: 8.7 10*3/uL (ref 4.0–10.5)

## 2018-06-27 LAB — URINALYSIS, ROUTINE W REFLEX MICROSCOPIC
Bilirubin Urine: NEGATIVE
Ketones, ur: NEGATIVE
Leukocytes, UA: NEGATIVE
Nitrite: NEGATIVE
Specific Gravity, Urine: 1.02 (ref 1.000–1.030)
Total Protein, Urine: NEGATIVE
URINE GLUCOSE: NEGATIVE
Urobilinogen, UA: 0.2 (ref 0.0–1.0)
pH: 7 (ref 5.0–8.0)

## 2018-06-27 LAB — HEMOGLOBIN A1C: Hgb A1c MFr Bld: 4.7 % (ref 4.6–6.5)

## 2018-06-27 LAB — LIPID PANEL
CHOLESTEROL: 154 mg/dL (ref 0–200)
HDL: 49.3 mg/dL (ref >39.00)
NonHDL: 104.33
Total CHOL/HDL Ratio: 3
Triglycerides: 278 mg/dL — ABNORMAL HIGH (ref 0.0–149.0)
VLDL: 55.6 mg/dL — AB (ref 0.0–40.0)

## 2018-06-27 LAB — COMPREHENSIVE METABOLIC PANEL
ALK PHOS: 37 U/L — AB (ref 39–117)
ALT: 22 U/L (ref 0–35)
AST: 20 U/L (ref 0–37)
Albumin: 4.5 g/dL (ref 3.5–5.2)
BILIRUBIN TOTAL: 0.6 mg/dL (ref 0.2–1.2)
BUN: 17 mg/dL (ref 6–23)
CALCIUM: 9.3 mg/dL (ref 8.4–10.5)
CO2: 28 mEq/L (ref 19–32)
Chloride: 106 mEq/L (ref 96–112)
Creatinine, Ser: 0.71 mg/dL (ref 0.40–1.20)
GFR: 113.66 mL/min (ref >60.00)
GLUCOSE: 102 mg/dL — AB (ref 70–99)
Potassium: 4.4 mEq/L (ref 3.5–5.1)
Sodium: 138 mEq/L (ref 135–145)
TOTAL PROTEIN: 7.4 g/dL (ref 6.0–8.3)

## 2018-06-27 LAB — TSH: TSH: 0.43 u[IU]/mL (ref 0.35–4.50)

## 2018-06-27 LAB — VITAMIN D 25 HYDROXY (VIT D DEFICIENCY, FRACTURES): VITD: 18.16 ng/mL — ABNORMAL LOW (ref 30.00–100.00)

## 2018-06-27 LAB — LDL CHOLESTEROL, DIRECT: Direct LDL: 70 mg/dL

## 2018-06-27 MED ORDER — METHYLPREDNISOLONE 4 MG PO TBPK: ORAL_TABLET | ORAL | 0 refills | Status: AC

## 2018-06-27 MED ORDER — LEVOCETIRIZINE DIHYDROCHLORIDE 5 MG PO TABS: 5.0000 mg | ORAL_TABLET | Freq: Every evening | ORAL | 1 refills | Status: DC

## 2018-06-27 MED ORDER — FLUTICASONE PROPIONATE 50 MCG/ACT NA SUSP
2.0000 | Freq: Every day | NASAL | 1 refills | Status: DC
Start: 2018-06-27 — End: 2019-02-05

## 2018-06-27 NOTE — Progress Notes (Signed)
Subjective:  Patient ID: Teresa Blankenship, female    DOB: 11/28/1970  Age: 47 y.o. MRN: [id number]  CC: Hypertension; Allergic Rhinitis ; and Annual Exam   HPI Teresa Blankenship presents for a CPX.  She complains of a several week history of severe nasal congestion, runny nose, and postnasal drip.  She is taking Sudafed and Motrin.  She is not currently using an antihistamine or steroid nasal spray.  She denies any recent episodes of headache, CP, DOE, palpitations, edema, or fatigue.  She is concerned that her blood pressure is not well controlled.  Outpatient Medications Prior to Visit  Medication Sig Dispense Refill  . MONONESSA 0.25-35 MG-MCG tablet TK 1 T PO  D  11  . Ascorbic Acid (VITAMIN C) 1000 MG tablet Take 1,000 mg by mouth daily.    . Biotin 1 MG CAPS Take by mouth.    . cetirizine (ZYRTEC) 10 MG tablet TAKE 1 TABLET(10 MG) BY MOUTH DAILY 90 tablet 1  . fluticasone (FLONASE) 50 MCG/ACT nasal spray INSTILL 2 SPRAYS IN EACH NOSTRIL DAILY. OFFICE VISIT NEEDED FOR FURTHER REFILLS 48 g 1  . amoxicillin-clavulanate (AUGMENTIN) 875-125 MG tablet amoxicillin 875 mg-potassium clavulanate 125 mg tablet  TK 1 T PO BID    . Ibuprofen-Famotidine (DUEXIS) 800-26.6 MG TABS Take 1 tablet by mouth 2 (two) times daily. 90 tablet 0  . methylPREDNISolone (MEDROL DOSEPAK) 4 MG TBPK tablet 6 day dose pack - take as directed 21 tablet 0   No facility-administered medications prior to visit.     ROS Review of Systems  Constitutional: Negative.  Negative for chills, diaphoresis and fatigue.  HENT: Positive for congestion, postnasal drip, rhinorrhea and sinus pressure. Negative for facial swelling, sinus pain, sore throat, trouble swallowing and voice change.   Eyes: Negative.   Respiratory: Negative.  Negative for cough, shortness of breath and wheezing.   Cardiovascular: Negative for chest pain, palpitations and leg swelling.  Gastrointestinal: Negative for abdominal pain, constipation,  diarrhea, nausea and vomiting.  Endocrine: Negative.   Genitourinary: Negative.  Negative for difficulty urinating.  Musculoskeletal: Negative.  Negative for arthralgias and myalgias.  Skin: Negative.  Negative for color change and pallor.  Neurological: Negative.  Negative for dizziness, weakness, light-headedness and headaches.  Hematological: Negative for adenopathy. Does not bruise/bleed easily.  Psychiatric/Behavioral: Negative.     Objective:  BP (!) 156/98 (BP Location: Left Arm, Patient Position: Sitting, Cuff Size: Large)   Pulse 90   Temp 98.4 F (36.9 C) (Oral)   Resp 16   Ht 5' 7"  (1.702 m)   Wt 193 lb 8 oz (87.8 kg)   LMP 06/14/2018   SpO2 98%   BMI 30.31 kg/m   BP Readings from Last 3 Encounters:  06/27/18 (!) 156/98  01/08/18 (!) 156/94  11/10/17 (!) 150/80    Wt Readings from Last 3 Encounters:  06/27/18 193 lb 8 oz (87.8 kg)  01/08/18 190 lb (86.2 kg)  11/10/17 191 lb (86.6 kg)    Physical Exam  Constitutional: She is oriented to person, place, and time. No distress.  HENT:  Nose: Mucosal edema and rhinorrhea present. No nasal deformity. No epistaxis.  No foreign bodies. Right sinus exhibits no maxillary sinus tenderness and no frontal sinus tenderness. Left sinus exhibits no maxillary sinus tenderness and no frontal sinus tenderness.  Mouth/Throat: Oropharynx is clear and moist and mucous membranes are normal. No oropharyngeal exudate, posterior oropharyngeal edema or posterior oropharyngeal erythema.  Eyes: Conjunctivae are normal. No  scleral icterus.  Neck: Normal range of motion. Neck supple. No JVD present. No thyromegaly present.  Cardiovascular: Normal rate, regular rhythm and normal heart sounds. Exam reveals no gallop.  No murmur heard. EKG ----  Sinus  Rhythm  WITHIN NORMAL LIMITS  Pulmonary/Chest: Effort normal and breath sounds normal. No respiratory distress. She has no wheezes. She has no rales.  Abdominal: Soft. Normal appearance and  bowel sounds are normal. She exhibits no mass. There is no hepatosplenomegaly. There is no tenderness.  Musculoskeletal: Normal range of motion. She exhibits no edema, tenderness or deformity.  Lymphadenopathy:    She has no cervical adenopathy.  Neurological: She is alert and oriented to person, place, and time.  Skin: Skin is warm and dry. She is not diaphoretic. No erythema. No pallor.  Vitals reviewed.   Lab Results  Component Value Date   WBC 8.7 06/27/2018   HGB 13.2 06/27/2018   HCT 39.2 06/27/2018   PLT 326.0 06/27/2018   GLUCOSE 102 (H) 06/27/2018   CHOL 154 06/27/2018   TRIG 278.0 (H) 06/27/2018   HDL 49.30 06/27/2018   LDLDIRECT 70.0 06/27/2018   ALT 22 06/27/2018   AST 20 06/27/2018   NA 138 06/27/2018   K 4.4 06/27/2018   CL 106 06/27/2018   CREATININE 0.71 06/27/2018   BUN 17 06/27/2018   CO2 28 06/27/2018   TSH 0.43 06/27/2018   HGBA1C 4.7 06/27/2018    Mr Cervical Spine W/o Contrast  Result Date: 05/08/2017 CLINICAL DATA:  47 y/o  F; neck pain radicular arm pain. EXAM: MRI CERVICAL SPINE WITHOUT CONTRAST TECHNIQUE: Multiplanar, multisequence MR imaging of the cervical spine was performed. No intravenous contrast was administered. COMPARISON:  04/26/2017 cervical spine radiographs. FINDINGS: Alignment: Straightening of cervical lordosis without listhesis. Vertebrae: No fracture, evidence of discitis, or bone lesion. Cord: Normal signal and morphology. Posterior Fossa, vertebral arteries, paraspinal tissues: Negative. Disc levels: C2-3: No significant disc displacement, foraminal narrowing, or canal stenosis. C3-4: Small disc bulge eccentric to the left with mild left foraminal stenosis. No significant canal stenosis. C4-5: Small disc osteophyte complex with prominent right-sided uncovertebral hypertrophy. Mild left and moderate to severe right foraminal stenosis. No significant canal stenosis. C5-6: Moderate disc osteophyte complex with bilateral uncovertebral and  facet hypertrophy. Moderate bilateral foraminal stenosis and mild canal stenosis with anterior cord impingement and flattening. C6-7: Disc osteophyte complex with left-greater-than-right uncovertebral and facet hypertrophy. Moderate to severe left and mild right foraminal stenosis. Right central 6 mm disc protrusion impinges the right anterior cord with cord flattening and moderate to severe canal stenosis. C7-T1: No significant disc displacement, foraminal narrowing, or canal stenosis. IMPRESSION: 1. C6-7 right central disc protrusion with right anterior cord impingement, cord flattening, and moderate to severe canal stenosis. 2. Multiple levels of mild foraminal stenosis with moderate to severe right C4-5, moderate bilateral C5-6, and moderate to severe left C6-7 foraminal stenosis. 3. No acute osseous abnormality.  No abnormal cord signal. Electronically Signed   By: Kristine Garbe M.D.   On: 05/08/2017 01:50    Assessment & Plan:   Teresa Blankenship was seen today for hypertension, allergic rhinitis  and annual exam.  Diagnoses and all orders for this visit:  Essential hypertension- Her blood pressure is not well controlled.  I have asked her to stop taking the anti-inflammatory and the decongestant.  I will treat the vitamin D deficiency.  The rest of her labs are negative for secondary causes or endorgan damage.  Her EKG is negative for LVH.  Will treat this with a thiazide diuretic.  Will screen for substance abuse. -     CBC with Differential/Platelet; Future -     Urinalysis, Routine w reflex microscopic; Future -     TSH; Future -     Cancel: Urine drugs of abuse scrn w alc, routine (Ref Lab); Future -     Comprehensive metabolic panel; Future -     VITAMIN D 25 Hydroxy (Vit-D Deficiency, Fractures); Future -     EKG 12-Lead -     Urine drugs of abuse scrn w alc, routine (Ref Lab); Future -     chlorthalidone (HYGROTON) 25 MG tablet; Take 1 tablet (25 mg total) by mouth daily.  Seasonal  allergic rhinitis due to pollen- She is having a moderately severe flareup.  Will treat with a course of methylprednisolone and I have asked her to start using a steroid nasal spray and an antihistamine. -     fluticasone (FLONASE) 50 MCG/ACT nasal spray; Place 2 sprays into both nostrils daily. -     levocetirizine (XYZAL) 5 MG tablet; Take 1 tablet (5 mg total) by mouth every evening. -     methylPREDNISolone (MEDROL DOSEPAK) 4 MG TBPK tablet; TAKE AS DIRECTED  Routine general medical examination at a health care facility- Exam completed, labs reviewed, she refused a flu vaccine today, Pap smear and mammogram are up-to-date.  Patient education material was given. -     Lipid panel; Future  Hyperglycemia -     Hemoglobin A1c; Future -     Comprehensive metabolic panel; Future  Chronic seasonal allergic rhinitis due to pollen  Need for Tdap vaccination  Vitamin D deficiency disease -     Cholecalciferol 50000 units capsule; Take 1 capsule (50,000 Units total) by mouth once a week.  Other orders -     Tdap vaccine greater than or equal to 7yo IM   I have discontinued [place] D. Ocon's vitamin C, Biotin, Ibuprofen-Famotidine, cetirizine, amoxicillin-clavulanate, and methylPREDNISolone. I have also changed her fluticasone. Additionally, I am having her start on levocetirizine, methylPREDNISolone, chlorthalidone, and Cholecalciferol. Lastly, I am having her maintain her MONONESSA.  Meds ordered this encounter  Medications  . fluticasone (FLONASE) 50 MCG/ACT nasal spray    Sig: Place 2 sprays into both nostrils daily.    Dispense:  48 g    Refill:  1  . levocetirizine (XYZAL) 5 MG tablet    Sig: Take 1 tablet (5 mg total) by mouth every evening.    Dispense:  90 tablet    Refill:  1  . methylPREDNISolone (MEDROL DOSEPAK) 4 MG TBPK tablet    Sig: TAKE AS DIRECTED    Dispense:  21 tablet    Refill:  0  . chlorthalidone (HYGROTON) 25 MG tablet    Sig: Take 1 tablet (25 mg total) by  mouth daily.    Dispense:  90 tablet    Refill:  0  . Cholecalciferol 50000 units capsule    Sig: Take 1 capsule (50,000 Units total) by mouth once a week.    Dispense:  12 capsule    Refill:  1     Follow-up: Return in about 6 weeks (around 08/08/2018).  Scarlette Calico, MD

## 2018-06-27 NOTE — Patient Instructions (Signed)

## 2018-06-28 ENCOUNTER — Encounter: Payer: Self-pay | Admitting: Internal Medicine

## 2018-06-28 DIAGNOSIS — E559 Vitamin D deficiency, unspecified: Secondary | ICD-10-CM | POA: Insufficient documentation

## 2018-06-28 MED ORDER — CHOLECALCIFEROL 1.25 MG (50000 UT) PO CAPS: 50000.0000 [IU] | ORAL_CAPSULE | ORAL | 1 refills | Status: DC

## 2018-06-28 MED ORDER — CHLORTHALIDONE 25 MG PO TABS: 25.0000 mg | ORAL_TABLET | Freq: Every day | ORAL | 0 refills | Status: DC

## 2018-07-02 LAB — URINE DRUGS OF ABUSE SCREEN W ALC, ROUTINE (REF LAB)
AMPHETAMINES, URINE: NEGATIVE ng/mL
BENZODIAZEPINE QUANT UR: NEGATIVE ng/mL
Barbiturate Quant, Ur: NEGATIVE ng/mL
COCAINE (METAB.): NEGATIVE ng/mL
Ethanol, Urine: NEGATIVE %
METHADONE SCREEN, URINE: NEGATIVE ng/mL
Opiate Quant, Ur: NEGATIVE ng/mL
PCP QUANT UR: NEGATIVE ng/mL
Propoxyphene: NEGATIVE ng/mL

## 2018-07-02 LAB — PANEL 799049
CARBOXY THC GC/MS CONF: 115 ng/mL
Cannabinoid GC/MS, Ur: POSITIVE — AB

## 2018-10-29 ENCOUNTER — Telehealth (INDEPENDENT_AMBULATORY_CARE_PROVIDER_SITE_OTHER): Payer: Self-pay | Admitting: Physical Medicine and Rehabilitation

## 2018-10-29 NOTE — Telephone Encounter (Signed)
She had severe canal stenosis at C6-7, so IF injection helped a lot and symptoms just started to return over last few months and no trauma or new weakness etc then ok to repeat but would eval and may not do one depending on findings. She likely is going to need ACDF

## 2018-10-30 ENCOUNTER — Other Ambulatory Visit (INDEPENDENT_AMBULATORY_CARE_PROVIDER_SITE_OTHER): Payer: Self-pay | Admitting: Physical Medicine and Rehabilitation

## 2018-10-30 ENCOUNTER — Telehealth (INDEPENDENT_AMBULATORY_CARE_PROVIDER_SITE_OTHER): Payer: Self-pay | Admitting: Physical Medicine and Rehabilitation

## 2018-10-30 DIAGNOSIS — Z1231 Encounter for screening mammogram for malignant neoplasm of breast: Secondary | ICD-10-CM | POA: Diagnosis not present

## 2018-10-30 DIAGNOSIS — M4802 Spinal stenosis, cervical region: Secondary | ICD-10-CM

## 2018-10-30 DIAGNOSIS — Z01419 Encounter for gynecological examination (general) (routine) without abnormal findings: Secondary | ICD-10-CM | POA: Diagnosis not present

## 2018-10-30 DIAGNOSIS — Z6831 Body mass index (BMI) 31.0-31.9, adult: Secondary | ICD-10-CM | POA: Diagnosis not present

## 2018-10-30 DIAGNOSIS — Z3041 Encounter for surveillance of contraceptive pills: Secondary | ICD-10-CM | POA: Diagnosis not present

## 2018-10-30 MED ORDER — MELOXICAM 15 MG PO TABS: 15.0000 mg | ORAL_TABLET | Freq: Every day | ORAL | 0 refills | Status: DC

## 2018-10-30 MED ORDER — METHOCARBAMOL 500 MG PO TABS: 500.0000 mg | ORAL_TABLET | Freq: Three times a day (TID) | ORAL | 0 refills | Status: DC | PRN

## 2018-10-30 NOTE — Telephone Encounter (Signed)
meloxicam (anti-inflammatory) and robaxin ( muscle relaxer ) sent, also can use tylenol ES 1 tid

## 2018-10-30 NOTE — Telephone Encounter (Signed)
Patient is scheduled for 11/15/18. She reports that the injection in 2018 really helped, and everything is the same as far as pain with no new injuries. She wants to know if there is something she can try for pain until her appointment. She reports Aleve and heat give her some relief. Please advise.

## 2018-10-30 NOTE — Telephone Encounter (Signed)
Called patient to advise  °

## 2018-11-01 ENCOUNTER — Encounter: Payer: Self-pay | Admitting: Nurse Practitioner

## 2018-11-01 ENCOUNTER — Ambulatory Visit (INDEPENDENT_AMBULATORY_CARE_PROVIDER_SITE_OTHER): Payer: BLUE CROSS/BLUE SHIELD | Admitting: Nurse Practitioner

## 2018-11-01 VITALS — BP 130/90 | HR 96 | Temp 98.8°F | Ht 67.0 in | Wt 191.0 lb

## 2018-11-01 DIAGNOSIS — J069 Acute upper respiratory infection, unspecified: Secondary | ICD-10-CM

## 2018-11-01 DIAGNOSIS — B9789 Other viral agents as the cause of diseases classified elsewhere: Secondary | ICD-10-CM | POA: Diagnosis not present

## 2018-11-01 MED ORDER — BENZONATATE 100 MG PO CAPS: 100.0000 mg | ORAL_CAPSULE | Freq: Two times a day (BID) | ORAL | 0 refills | Status: DC | PRN

## 2018-11-01 MED ORDER — AZITHROMYCIN 250 MG PO TABS: ORAL_TABLET | ORAL | 0 refills | Status: DC

## 2018-11-01 NOTE — Patient Instructions (Signed)
Tessalon as prescribed  Start antibiotic if symptoms continue for more than 1 week, as disussed    Viral Respiratory Infection A viral respiratory infection is an illness that affects parts of the body that are used for breathing. These include the lungs, nose, and throat. It is caused by a germ called a virus. Some examples of this kind of infection are:  A cold.  The flu (influenza).  A respiratory syncytial virus (RSV) infection. A person who gets this illness may have the following symptoms:  A stuffy or runny nose.  Yellow or green fluid in the nose.  A cough.  Sneezing.  Tiredness (fatigue).  Achy muscles.  A sore throat.  Sweating or chills.  A fever.  A headache. Follow these instructions at home: Managing pain and congestion  Take over-the-counter and prescription medicines only as told by your doctor.  If you have a sore throat, gargle with salt water. Do this 3-4 times per day or as needed. To make a salt-water mixture, dissolve -1 tsp of salt in 1 cup of warm water. Make sure that all the salt dissolves.  Use nose drops made from salt water. This helps with stuffiness (congestion). It also helps soften the skin around your nose.  Drink enough fluid to keep your pee (urine) pale yellow. General instructions   Rest as much as possible.  Do not drink alcohol.  Do not use any products that have nicotine or tobacco, such as cigarettes and e-cigarettes. If you need help quitting, ask your doctor.  Keep all follow-up visits as told by your doctor. This is important. How is this prevented?   Get a flu shot every year. Ask your doctor when you should get your flu shot.  Do not let other people get your germs. If you are sick: ? Stay home from work or school. ? Wash your hands with soap and water often. Wash your hands after you cough or sneeze. If soap and water are not available, use hand sanitizer.  Avoid contact with people who are sick during  cold and flu season. This is in fall and winter. Get help if:  Your symptoms last for 10 days or longer.  Your symptoms get worse over time.  You have a fever.  You have very bad pain in your face or forehead.  Parts of your jaw or neck become very swollen. Get help right away if:  You feel pain or pressure in your chest.  You have shortness of breath.  You faint or feel like you will faint.  You keep throwing up (vomiting).  You feel confused. Summary  A viral respiratory infection is an illness that affects parts of the body that are used for breathing.  Examples of this illness include a cold, the flu, and respiratory syncytial virus (RSV) infection.  The infection can cause a runny nose, cough, sneezing, sore throat, and fever.  Follow what your doctor tells you about taking medicines, drinking lots of fluid, washing your hands, resting at home, and avoiding people who are sick. This information is not intended to replace advice given to you by your health care provider. Make sure you discuss any questions you have with your health care provider. Document Released: 09/08/2008 Document Revised: 11/06/2017 Document Reviewed: 11/06/2017 Elsevier Interactive Patient Education  2019 [employer].

## 2018-11-01 NOTE — Progress Notes (Signed)
Teresa Blankenship is a 48 y.o. female with the following history as recorded in [employer]:  Patient Active Problem List   Diagnosis Date Noted  . Vitamin D deficiency disease 06/28/2018  . Menopausal symptom 01/08/2018  . Essential hypertension 09/14/2016  . Routine general medical examination at a health care facility 09/13/2016  . Hyperglycemia 09/13/2016  . Allergic rhinitis 01/20/2012    Current Outpatient Medications  Medication Sig Dispense Refill  . chlorthalidone (HYGROTON) 25 MG tablet Take 1 tablet (25 mg total) by mouth daily. 90 tablet 0  . Cholecalciferol 50000 units capsule Take 1 capsule (50,000 Units total) by mouth once a week. 12 capsule 1  . fluticasone (FLONASE) 50 MCG/ACT nasal spray Place 2 sprays into both nostrils daily. 48 g 1  . levocetirizine (XYZAL) 5 MG tablet Take 1 tablet (5 mg total) by mouth every evening. 90 tablet 1  . meloxicam (MOBIC) 15 MG tablet Take 1 tablet (15 mg total) by mouth daily. Take with food 30 tablet 0  . methocarbamol (ROBAXIN) 500 MG tablet Take 1 tablet (500 mg total) by mouth every 8 (eight) hours as needed for muscle spasms. 60 tablet 0  . MONONESSA 0.25-35 MG-MCG tablet TK 1 T PO  D  11  . azithromycin (ZITHROMAX) 250 MG tablet Take 2 tablets on day 1, then take 1 tablet every day until complete. 6 tablet 0  . benzonatate (TESSALON) 100 MG capsule Take 1 capsule (100 mg total) by mouth 2 (two) times daily as needed for cough. 20 capsule 0   No current facility-administered medications for this visit.     Allergies: Patient has no known allergies.  Past Medical History:  Diagnosis Date  . Allergy     Past Surgical History:  Procedure Laterality Date  . SHOULDER SURGERY Left   . WISDOM TOOTH EXTRACTION      Family History  Problem Relation Age of Onset  . Cancer Mother   . Diabetes Mother   . Heart disease Father   . Hypertension Brother     Social History   Tobacco Use  . Smoking status: Never Smoker  . Smokeless  tobacco: Never Used  Substance Use Topics  . Alcohol use: Yes    Alcohol/week: 2.0 standard drinks    Types: 2 Standard drinks or equivalent per week    Comment: occasional liquor     Subjective:  Teresa Blankenship is here today requesting evaluation of acute complaint of cough/cold symptoms, which first began about 2-3 days ago, feeling worse since onset. Reports: chills, sinus pain/ pressure, nasal drainage, postnasal drip, dry cough Denies: syncope, confusion, cp, sob, abdominal pain, nausea, vomiting, bowel or bladder changes, diarrhea Smoker? Marijuana smoker, not currently  Tried at home: flonase, sudafed PE Recent travel: no Son is sick with similar symptoms  ROS - See HPI  Objective:  Vitals:   11/01/18 1551 11/01/18 1639  BP: (!) 164/100 130/90  Pulse: 96   Temp: 98.8 F (37.1 C)   TempSrc: Oral   SpO2: 99%   Weight: 191 lb (86.6 kg)   Height: 5' 7"  (1.702 m)     General: Well developed, well nourished, in no acute distress  Skin : Warm and dry.  Head: Normocephalic and atraumatic  Eyes: Sclera and conjunctiva clear; pupils round and reactive to light; extraocular movements intact  Ears: External normal; canals clear; tympanic membranes normal  Oropharynx: mild posterior oropharyngeal erythema, no exudate or edema.. No suspicious lesions  Neck: Supple without thyromegaly, adenopathy  Lungs: Respirations unlabored; clear to auscultation bilaterally; dry cough CVS exam: normal rate and regular rhythm.  Extremities: No edema, cyanosis, clubbing  Vessels: Symmetric bilaterally  Neurologic: Alert and oriented; speech intact; face symmetrical; moves all extremities well; CNII-XII intact without focal deficit  Psychiatric: Normal mood and affect.  Assessment:  1. Viral URI with cough     Plan:   Suspect symptoms are viral at this point which we discussed Discussed watchful waiting, antibiotic stewardship-provided with printed prescription for azithromycin to begin only if  symptoms persist >1 week, she is agreeable Tessalon sent Medication dosing, side effects discussed Smoking cessation encouarged Home management, red flags and return precautions including when to seek immediate care discussed and printed on AVS  No follow-ups on file.  No orders of the defined types were placed in this encounter.   Requested Prescriptions   Signed Prescriptions Disp Refills  . azithromycin (ZITHROMAX) 250 MG tablet 6 tablet 0    Sig: Take 2 tablets on day 1, then take 1 tablet every day until complete.  . benzonatate (TESSALON) 100 MG capsule 20 capsule 0    Sig: Take 1 capsule (100 mg total) by mouth 2 (two) times daily as needed for cough.

## 2018-11-01 NOTE — Telephone Encounter (Signed)
Spoke with [place] P from pt insurance and PA is not needed for [phone]. Reference # [id number]

## 2018-11-15 ENCOUNTER — Encounter (INDEPENDENT_AMBULATORY_CARE_PROVIDER_SITE_OTHER): Payer: Self-pay | Admitting: Physical Medicine and Rehabilitation

## 2018-11-15 ENCOUNTER — Ambulatory Visit (INDEPENDENT_AMBULATORY_CARE_PROVIDER_SITE_OTHER): Payer: BLUE CROSS/BLUE SHIELD | Admitting: Physical Medicine and Rehabilitation

## 2018-11-15 ENCOUNTER — Encounter

## 2018-11-15 ENCOUNTER — Ambulatory Visit (INDEPENDENT_AMBULATORY_CARE_PROVIDER_SITE_OTHER): Payer: Self-pay

## 2018-11-15 VITALS — BP 135/95 | HR 89 | Temp 98.7°F | Ht 66.0 in | Wt 190.0 lb

## 2018-11-15 DIAGNOSIS — M4802 Spinal stenosis, cervical region: Secondary | ICD-10-CM

## 2018-11-15 DIAGNOSIS — M5412 Radiculopathy, cervical region: Secondary | ICD-10-CM

## 2018-11-15 DIAGNOSIS — M542 Cervicalgia: Secondary | ICD-10-CM

## 2018-11-15 DIAGNOSIS — M25512 Pain in left shoulder: Secondary | ICD-10-CM

## 2018-11-15 DIAGNOSIS — G8929 Other chronic pain: Secondary | ICD-10-CM

## 2018-11-15 MED ORDER — METHYLPREDNISOLONE ACETATE 80 MG/ML IJ SUSP
80.0000 mg | Freq: Once | INTRAMUSCULAR | Status: AC
  Administered 2018-11-15: 80 mg

## 2018-11-15 NOTE — Progress Notes (Signed)
 .  Numeric Pain Rating Scale and Functional Assessment Average Pain 10 Pain Right Now 8 My pain is intermittent, constant and sharp Pain is worse with: laying on left side Pain improves with: medication   In the last MONTH (on 0-10 scale) has pain interfered with the following?  1. General activity like being  able to carry out your everyday physical activities such as walking, climbing stairs, carrying groceries, or moving a chair?  Rating(7)  2. Relation with others like being able to carry out your usual social activities and roles such as  activities at home, at work and in your community. Rating(7)  3. Enjoyment of life such that you have  been bothered by emotional problems such as feeling anxious, depressed or irritable?  Rating(2)  +Driver, -BT, -Dye Allergies

## 2018-11-16 ENCOUNTER — Encounter (INDEPENDENT_AMBULATORY_CARE_PROVIDER_SITE_OTHER): Payer: Self-pay | Admitting: Physical Medicine and Rehabilitation

## 2018-11-16 NOTE — Procedures (Signed)
Cervical Epidural Steroid Injection - Interlaminar Approach with Fluoroscopic Guidance  Patient: Teresa Blankenship      Date of Birth: Oct 06, 1971 MRN: [id number] PCP: Janith Lima, MD      Visit Date: 11/15/2018   Universal Protocol:    Date/Time: 02/07/206:06 AM  Consent Given By: the patient  Position: PRONE  Additional Comments: Vital signs were monitored before and after the procedure. Patient was prepped and draped in the usual sterile fashion. The correct patient, procedure, and site was verified.   Injection Procedure Details:  Procedure Site One Meds Administered:  Meds ordered this encounter  Medications  . methylPREDNISolone acetate (DEPO-MEDROL) injection 80 mg     Laterality: Left  Location/Site: C7-T1  Needle size: 20 G  Needle type: Touhy  Needle Placement: Paramedian epidural space  Findings:  -Comments: Excellent flow of contrast into the epidural space.  Procedure Details: Using a paramedian approach from the side mentioned above, the region overlying the inferior lamina was localized under fluoroscopic visualization and the soft tissues overlying this structure were infiltrated with 4 ml. of 1% Lidocaine without Epinephrine. A # 20 gauge, Tuohy needle was inserted into the epidural space using a paramedian approach.  The epidural space was localized using loss of resistance along with lateral and contralateral oblique bi-planar fluoroscopic views.  After negative aspirate for air, blood, and CSF, a 2 ml. volume of Isovue-250 was injected into the epidural space and the flow of contrast was observed. Radiographs were obtained for documentation purposes.   The injectate was administered into the level noted above.  Additional Comments:  The patient tolerated the procedure well Dressing: Band-Aid    Post-procedure details: Patient was observed during the procedure. Post-procedure instructions were reviewed.  Patient left the clinic in stable  condition.

## 2018-11-16 NOTE — Progress Notes (Signed)
Teresa Blankenship - 48 y.o. female MRN [id number]  Date of birth: [birth year]  Office Visit Note: Visit Date: 11/15/2018 PCP: Janith Lima, MD Referred by: Janith Lima, MD  Subjective: Chief Complaint  Patient presents with  . Neck - Pain  . Left Shoulder - Pain  . Left Arm - Pain   HPI: Teresa Blankenship is a 48 y.o. female who comes in today For evaluation management of chronic severe left-sided neck pain with referral into the left shoulder and arm.  Brief history is that we last saw her in August 2018 and completed cervical C7-T1 interlaminar epidural steroid injection with really good relief of her symptoms.  Prior to that she has been followed by Dr. Anderson Malta in our office for her shoulder.  She is actually had prior shoulder surgery.  In 2018 he felt like maybe this was a radicular problem and did get MRI of the cervical spine.  This is reviewed again with the patient today.  She comes back in saying for the last several months it has worsened again in the left shoulder and she feels like exactly the same pain.  Is more of a posterior lateral shoulder pain some referral in the arm and forearm but not as much in the hand that she had in the past.  In the past it was more classic C6 and C7 distribution into the lateral part of the hand.  She denies any right-sided complaints.  Her pain is fairly constant but intermittently sharp and is worse lying on the left side.  Medication helps which is mainly muscle relaxer.  She denies any focal weakness or associated headache or any fever chills or night sweats.  No new trauma.  She has not had any balance difficulties.  No bowel or bladder changes.  It really is affecting her daily living at this point is been ongoing now for several months.  Review of Systems  Constitutional: Negative for chills, fever, malaise/fatigue and weight loss.  HENT: Negative for hearing loss and sinus pain.   Eyes: Negative for blurred vision, double vision and  photophobia.  Respiratory: Negative for cough and shortness of breath.   Cardiovascular: Negative for chest pain, palpitations and leg swelling.  Gastrointestinal: Negative for abdominal pain, nausea and vomiting.  Genitourinary: Negative for flank pain.  Musculoskeletal: Positive for joint pain and neck pain. Negative for myalgias.       Left arm pain  Skin: Negative for itching and rash.  Neurological: Negative for tremors, focal weakness and weakness.  Endo/Heme/Allergies: Negative.   Psychiatric/Behavioral: Negative for depression.  All other systems reviewed and are negative.  Otherwise per HPI.  Assessment & Plan: Visit Diagnoses:  1. Cervical radiculopathy   2. Spinal stenosis of cervical region   3. Cervicalgia   4. Chronic left shoulder pain     Plan: Findings:  Chronic history of neck and shoulder pain with radicular pain in the arm.  Prior history of shoulder surgery but most recently had cervical epidural injection in 2018 with good relief.  She does have findings consistent on MRI which was done at that time.  She is had no new trauma or red flag complaints.  Symptoms seem to be pretty consistent once again with re-exacerbation of existing cervical spine problem.  I do think epidural injection at C7-T1 is warranted.  This would be diagnostic and hopefully therapeutic and done with fluoroscopic guidance.  Duda severity of the symptoms being 10 out of  10 pain we can do the injection today.  Exam does not show any focal impingement of the shoulder.  There is no red flag findings on exam.    Meds & Orders:  Meds ordered this encounter  Medications  . methylPREDNISolone acetate (DEPO-MEDROL) injection 80 mg    Orders Placed This Encounter  Procedures  . XR C-ARM NO REPORT  . Epidural Steroid injection    Follow-up: Return if symptoms worsen or fail to improve.   Procedures: No procedures performed  Cervical Epidural Steroid Injection - Interlaminar Approach with  Fluoroscopic Guidance  Patient: Teresa Blankenship      Date of Birth: May 08, 1971 MRN: [id number] PCP: Janith Lima, MD      Visit Date: 11/15/2018   Universal Protocol:    Date/Time: 02/07/206:06 AM  Consent Given By: the patient  Position: PRONE  Additional Comments: Vital signs were monitored before and after the procedure. Patient was prepped and draped in the usual sterile fashion. The correct patient, procedure, and site was verified.   Injection Procedure Details:  Procedure Site One Meds Administered:  Meds ordered this encounter  Medications  . methylPREDNISolone acetate (DEPO-MEDROL) injection 80 mg     Laterality: Left  Location/Site: C7-T1  Needle size: 20 G  Needle type: Touhy  Needle Placement: Paramedian epidural space  Findings:  -Comments: Excellent flow of contrast into the epidural space.  Procedure Details: Using a paramedian approach from the side mentioned above, the region overlying the inferior lamina was localized under fluoroscopic visualization and the soft tissues overlying this structure were infiltrated with 4 ml. of 1% Lidocaine without Epinephrine. A # 20 gauge, Tuohy needle was inserted into the epidural space using a paramedian approach.  The epidural space was localized using loss of resistance along with lateral and contralateral oblique bi-planar fluoroscopic views.  After negative aspirate for air, blood, and CSF, a 2 ml. volume of Isovue-250 was injected into the epidural space and the flow of contrast was observed. Radiographs were obtained for documentation purposes.   The injectate was administered into the level noted above.  Additional Comments:  The patient tolerated the procedure well Dressing: Band-Aid    Post-procedure details: Patient was observed during the procedure. Post-procedure instructions were reviewed.  Patient left the clinic in stable condition.   Clinical History: MRI CERVICAL SPINE WITHOUT  CONTRAST  TECHNIQUE: Multiplanar, multisequence MR imaging of the cervical spine was performed. No intravenous contrast was administered.  COMPARISON:  04/26/2017 cervical spine radiographs.  FINDINGS: Alignment: Straightening of cervical lordosis without listhesis.  Vertebrae: No fracture, evidence of discitis, or bone lesion.  Cord: Normal signal and morphology.  Posterior Fossa, vertebral arteries, paraspinal tissues: Negative.  Disc levels:  C2-3: No significant disc displacement, foraminal narrowing, or canal stenosis.  C3-4: Small disc bulge eccentric to the left with mild left foraminal stenosis. No significant canal stenosis.  C4-5: Small disc osteophyte complex with prominent right-sided uncovertebral hypertrophy. Mild left and moderate to severe right foraminal stenosis. No significant canal stenosis.  C5-6: Moderate disc osteophyte complex with bilateral uncovertebral and facet hypertrophy. Moderate bilateral foraminal stenosis and mild canal stenosis with anterior cord impingement and flattening.  C6-7: Disc osteophyte complex with left-greater-than-right uncovertebral and facet hypertrophy. Moderate to severe left and mild right foraminal stenosis. Right central 6 mm disc protrusion impinges the right anterior cord with cord flattening and moderate to severe canal stenosis.  C7-T1: No significant disc displacement, foraminal narrowing, or canal stenosis.  IMPRESSION: 1. C6-7 right central  disc protrusion with right anterior cord impingement, cord flattening, and moderate to severe canal stenosis. 2. Multiple levels of mild foraminal stenosis with moderate to severe right C4-5, moderate bilateral C5-6, and moderate to severe left C6-7 foraminal stenosis. 3. No acute osseous abnormality.  No abnormal cord signal.   Electronically Signed   By: Kristine Garbe M.D.   On: 05/08/2017 01:50   She reports that she has never smoked.  She has never used smokeless tobacco.  Recent Labs    06/27/18 1646  HGBA1C 4.7    Objective:  VS:  HT:5' 6"  (167.6 cm)   WT:190 lb (86.2 kg)  BMI:30.68    BP:(!) 135/95  HR:89bpm  TEMP:98.7 F (37.1 C)(Oral)  RESP:  Physical Exam Vitals signs and nursing note reviewed.  Constitutional:      General: She is not in acute distress.    Appearance: Normal appearance. She is well-developed. She is not ill-appearing.  HENT:     Head: Normocephalic and atraumatic.  Eyes:     Conjunctiva/sclera: Conjunctivae normal.     Pupils: Pupils are equal, round, and reactive to light.  Neck:     Musculoskeletal: Muscular tenderness present.  Cardiovascular:     Rate and Rhythm: Normal rate.     Pulses: Normal pulses.  Pulmonary:     Effort: Pulmonary effort is normal.  Musculoskeletal:     Right lower leg: No edema.     Left lower leg: No edema.     Comments: Cervical spine range of motion limited with left rotation and extension with equivocal Spurling's test.  She has some mild trigger point issues in the trapezius and supraspinatus.  No real shoulder impingement.  She has good strength in the upper extremities with long finger flexion and wrist extension and abduction.  She has 2+ muscle stretch reflexes at the biceps and brachioradialis.  She has a negative Hoffmann's test bilaterally.  Lymphadenopathy:     Cervical: No cervical adenopathy.  Skin:    General: Skin is warm and dry.     Findings: No erythema or rash.  Neurological:     General: No focal deficit present.     Mental Status: She is alert and oriented to person, place, and time.     Sensory: No sensory deficit.     Motor: No abnormal muscle tone.     Coordination: Coordination normal.     Gait: Gait normal.  Psychiatric:        Mood and Affect: Mood normal.        Behavior: Behavior normal.     Ortho Exam Imaging: Xr C-arm No Report  Result Date: 11/15/2018 Please see Notes tab for imaging impression.   Past  Medical/Family/Surgical/Social History: Medications & Allergies reviewed per EMR, new medications updated. Patient Active Problem List   Diagnosis Date Noted  . Vitamin D deficiency disease 06/28/2018  . Menopausal symptom 01/08/2018  . Essential hypertension 09/14/2016  . Routine general medical examination at a health care facility 09/13/2016  . Hyperglycemia 09/13/2016  . Allergic rhinitis 01/20/2012   Past Medical History:  Diagnosis Date  . Allergy    Family History  Problem Relation Age of Onset  . Cancer Mother   . Diabetes Mother   . Heart disease Father   . Hypertension Brother    Past Surgical History:  Procedure Laterality Date  . SHOULDER SURGERY Left   . WISDOM TOOTH EXTRACTION     Social History   Occupational History  . Not  on file  Tobacco Use  . Smoking status: Never Smoker  . Smokeless tobacco: Never Used  Substance and Sexual Activity  . Alcohol use: Yes    Alcohol/week: 2.0 standard drinks    Types: 2 Standard drinks or equivalent per week    Comment: occasional liquor  . Drug use: Yes    Types: Marijuana  . Sexual activity: Yes    Birth control/protection: Condom, Pill

## 2018-11-21 ENCOUNTER — Encounter: Payer: Self-pay | Admitting: Nurse Practitioner

## 2018-11-21 ENCOUNTER — Other Ambulatory Visit: Payer: BLUE CROSS/BLUE SHIELD

## 2018-11-21 ENCOUNTER — Ambulatory Visit (INDEPENDENT_AMBULATORY_CARE_PROVIDER_SITE_OTHER): Payer: BLUE CROSS/BLUE SHIELD | Admitting: Nurse Practitioner

## 2018-11-21 VITALS — BP 140/86 | HR 91 | Ht 66.0 in | Wt 189.0 lb

## 2018-11-21 DIAGNOSIS — R300 Dysuria: Secondary | ICD-10-CM | POA: Diagnosis not present

## 2018-11-21 DIAGNOSIS — N3001 Acute cystitis with hematuria: Secondary | ICD-10-CM | POA: Diagnosis not present

## 2018-11-21 LAB — POCT URINALYSIS DIPSTICK
BILIRUBIN UA: NEGATIVE
Glucose, UA: NEGATIVE
Ketones, UA: NEGATIVE
Nitrite, UA: NEGATIVE
PH UA: 7.5 (ref 5.0–8.0)
Protein, UA: NEGATIVE
RBC UA: POSITIVE
Spec Grav, UA: 1.02 (ref 1.010–1.025)
Urobilinogen, UA: 0.2 E.U./dL

## 2018-11-21 MED ORDER — NITROFURANTOIN MACROCRYSTAL 100 MG PO CAPS: 100.0000 mg | ORAL_CAPSULE | Freq: Two times a day (BID) | ORAL | 0 refills | Status: DC

## 2018-11-21 NOTE — Progress Notes (Signed)
Teresa Blankenship is a 48 y.o. female with the following history as recorded in Epic[facility]:  Patient Active Problem List   Diagnosis Date Noted  . Vitamin D deficiency disease 06/28/2018  . Menopausal symptom 01/08/2018  . Essential hypertension 09/14/2016  . Routine general medical examination at a health care facility 09/13/2016  . Hyperglycemia 09/13/2016  . Allergic rhinitis 01/20/2012    Current Outpatient Medications  Medication Sig Dispense Refill  . benzonatate (TESSALON) 100 MG capsule Take 1 capsule (100 mg total) by mouth 2 (two) times daily as needed for cough. 20 capsule 0  . chlorthalidone (HYGROTON) 25 MG tablet Take 1 tablet (25 mg total) by mouth daily. 90 tablet 0  . Cholecalciferol 50000 units capsule Take 1 capsule (50,000 Units total) by mouth once a week. 12 capsule 1  . fluticasone (FLONASE) 50 MCG/ACT nasal spray Place 2 sprays into both nostrils daily. 48 g 1  . levocetirizine (XYZAL) 5 MG tablet Take 1 tablet (5 mg total) by mouth every evening. 90 tablet 1  . meloxicam (MOBIC) 15 MG tablet Take 1 tablet (15 mg total) by mouth daily. Take with food 30 tablet 0  . methocarbamol (ROBAXIN) 500 MG tablet Take 1 tablet (500 mg total) by mouth every 8 (eight) hours as needed for muscle spasms. 60 tablet 0  . MONONESSA 0.25-35 MG-MCG tablet TK 1 T PO  D  11   No current facility-administered medications for this visit.     Allergies: Patient has no known allergies.  Past Medical History:  Diagnosis Date  . Allergy     Past Surgical History:  Procedure Laterality Date  . SHOULDER SURGERY Left   . WISDOM TOOTH EXTRACTION      Family History  Problem Relation Age of Onset  . Cancer Mother   . Diabetes Mother   . Heart disease Father   . Hypertension Brother     Social History   Tobacco Use  . Smoking status: Never Smoker  . Smokeless tobacco: Never Used  Substance Use Topics  . Alcohol use: Yes    Alcohol/week: 2.0 standard drinks    Types: 2 Standard  drinks or equivalent per week    Comment: occasional liquor     Subjective:  Teresa Blankenship is here today requesting evaluation of urinary symptoms- Dysuria, hematuria, urinary hesitancy, mild pelvic and lower back pain/pressure, which began about 3 days ago , symptoms have persisted since onset. Symptoms feel like when she had a UTI 2 years ago She denies fevers, chills, abdominal pain, n/v/d, vaginal discharge, increased urinary frequency. Currently On oral contraceptives, reports regular monthly cycles, last cycle ended about 3 days ago  ROS- See HPI  Objective:  Vitals:   11/21/18 1558  BP: 140/86  Pulse: 91  SpO2: 97%  Weight: 189 lb (85.7 kg)  Height: 5' 6"  (1.676 m)    General: Well developed, well nourished, in no acute distress  Skin : Warm and dry.  Head: Normocephalic and atraumatic  Eyes: Sclera and conjunctiva clear; pupils round and reactive to light; extraocular movements intact  Oropharynx: Pink, supple. No suspicious lesions  Neck: Supple Lungs: Respirations unlabored; clear to auscultation bilaterally  CVS exam: normal rate and regular rhythm, S1 and S2 normal.  Abdomen: Soft; nontender; nondistended; normoactive bowel sounds; no masses or hepatosplenomegaly; no CVA tenderness  Extremities: No edema, cyanosis, clubbing  Vessels: Symmetric bilaterally  Neurologic: Alert and oriented; speech intact; face symmetrical; moves all extremities well; CNII-XII intact without focal deficit  Psychiatric: Normal mood and affect.  Assessment:  1. Dysuria     Plan:   Based on symptoms and POCT urinalysis with blood and leuks, will treat with course of abx for possible UTI while awaiting urine culture Nitrofurantoin sent- med dosing, side effects discussed Home management, red flags and return precautions including when to seek immediate care discussed and printed on AVS F/U with further recommendations pending urine culture results  No follow-ups on file.  Orders Placed  This Encounter  Procedures  . Urine Culture    Standing Status:   Future    Standing Expiration Date:   12/20/2018  . POCT urinalysis dipstick    Requested Prescriptions   Pending Prescriptions Disp Refills  . nitrofurantoin (MACRODANTIN) 100 MG capsule 10 capsule 0    Sig: Take 1 capsule (100 mg total) by mouth 2 (two) times daily.

## 2018-11-21 NOTE — Patient Instructions (Signed)

## 2018-11-24 LAB — URINE CULTURE
MICRO NUMBER:: 185894
SPECIMEN QUALITY:: ADEQUATE

## 2018-11-26 ENCOUNTER — Other Ambulatory Visit: Payer: Self-pay | Admitting: Nurse Practitioner

## 2018-11-26 MED ORDER — SULFAMETHOXAZOLE-TRIMETHOPRIM 800-160 MG PO TABS: 1.0000 | ORAL_TABLET | Freq: Two times a day (BID) | ORAL | 0 refills | Status: DC

## 2019-02-05 ENCOUNTER — Other Ambulatory Visit: Payer: Self-pay | Admitting: Internal Medicine

## 2019-02-05 DIAGNOSIS — J301 Allergic rhinitis due to pollen: Secondary | ICD-10-CM

## 2019-03-29 ENCOUNTER — Encounter: Payer: Self-pay | Admitting: Family

## 2019-03-29 ENCOUNTER — Ambulatory Visit (INDEPENDENT_AMBULATORY_CARE_PROVIDER_SITE_OTHER): Payer: BC Managed Care – PPO | Admitting: Family

## 2019-03-29 ENCOUNTER — Other Ambulatory Visit: Payer: Self-pay

## 2019-03-29 DIAGNOSIS — I10 Essential (primary) hypertension: Secondary | ICD-10-CM | POA: Diagnosis not present

## 2019-03-29 DIAGNOSIS — J019 Acute sinusitis, unspecified: Secondary | ICD-10-CM | POA: Diagnosis not present

## 2019-03-29 MED ORDER — AMOXICILLIN-POT CLAVULANATE 875-125 MG PO TABS: 1.0000 | ORAL_TABLET | Freq: Two times a day (BID) | ORAL | 0 refills | Status: DC

## 2019-03-29 NOTE — Progress Notes (Signed)
Teresa Blankenship is a 48 y.o. female with the following history as recorded in Epic[facility]:  Patient Active Problem List   Diagnosis Date Noted  . Vitamin D deficiency disease 06/28/2018  . Menopausal symptom 01/08/2018  . Essential hypertension 09/14/2016  . Routine general medical examination at a health care facility 09/13/2016  . Hyperglycemia 09/13/2016  . Allergic rhinitis 01/20/2012    Current Outpatient Medications  Medication Sig Dispense Refill  . Cholecalciferol 50000 units capsule Take 1 capsule (50,000 Units total) by mouth once a week. 12 capsule 1  . fluticasone (FLONASE) 50 MCG/ACT nasal spray SHAKE LIQUID AND USE 2 SPRAYS IN EACH NOSTRIL DAILY 48 g 1  . levocetirizine (XYZAL) 5 MG tablet Take 1 tablet (5 mg total) by mouth every evening. 90 tablet 1  . chlorthalidone (HYGROTON) 25 MG tablet Take 1 tablet (25 mg total) by mouth daily. (Patient not taking: Reported on 03/29/2019) 90 tablet 0  . MONONESSA 0.25-35 MG-MCG tablet TK 1 T PO  D  11   No current facility-administered medications for this visit.     Allergies: Patient has no known allergies.  Past Medical History:  Diagnosis Date  . Allergy     Past Surgical History:  Procedure Laterality Date  . SHOULDER SURGERY Left   . WISDOM TOOTH EXTRACTION      Family History  Problem Relation Age of Onset  . Cancer Mother   . Diabetes Mother   . Heart disease Father   . Hypertension Brother     Social History   Tobacco Use  . Smoking status: Never Smoker  . Smokeless tobacco: Never Used  Substance Use Topics  . Alcohol use: Yes    Alcohol/week: 2.0 standard drinks    Types: 2 Standard drinks or equivalent per week    Comment: occasional liquor    Subjective:    I connected with Meckenzie Balsley Macnair on 03/29/19 at  2:00 PM EDT by a video enabled telemedicine application and verified that I am speaking with the correct person using two identifiers. Patient and I are the only 2 people on the video call.    I  discussed the limitations of evaluation and management by telemedicine and the availability of in person appointments. The patient expressed understanding and agreed to proceed.  Patient is concerned she has a sinus infection; is prone to sinus infection; symptoms x 1 week; + frontal pain, pressure; has been taking her Xyzal, Flonase and OTC Sudafed; no fever, no chest pain, no shortness of breath.    LMP- last week  Objective:  There were no vitals filed for this visit.  General: Well developed, well nourished, in no acute distress  Skin : Warm and dry.  Head: Normocephalic and atraumatic  Lungs: Respirations unlabored;  Neurologic: Alert and oriented; speech intact; face symmetrical;   Assessment:  1. Acute sinusitis, recurrence not specified, unspecified location   2. Essential hypertension     Plan:  1. Suspect allergy component; encouraged to continue Flonase and Xyzal; Rx for Augmentin 875 mg bid x 10 days; increase fluids, rest and follow-up worse, no better. 2. Patient has stopped her blood pressure medication; she is encouraged to start keeping regular log of her blood pressure and bring readings to next appt with her PCP to review/ discuss.    No follow-ups on file.  No orders of the defined types were placed in this encounter.   Requested Prescriptions    No prescriptions requested or ordered in this  encounter

## 2019-04-15 ENCOUNTER — Telehealth: Payer: BC Managed Care – PPO | Admitting: Physician Assistant

## 2019-04-15 ENCOUNTER — Encounter: Payer: Self-pay | Admitting: Physician Assistant

## 2019-04-15 DIAGNOSIS — N390 Urinary tract infection, site not specified: Secondary | ICD-10-CM | POA: Diagnosis not present

## 2019-04-15 MED ORDER — NITROFURANTOIN MONOHYD MACRO 100 MG PO CAPS: 100.0000 mg | ORAL_CAPSULE | Freq: Two times a day (BID) | ORAL | 0 refills | Status: DC

## 2019-04-15 NOTE — Progress Notes (Signed)
We are sorry that you are not feeling well.  Here is how we plan to help!  Based on what you shared with me it looks like you most likely have a simple urinary tract infection.  A UTI (Urinary Tract Infection) is a bacterial infection of the bladder.  Most cases of urinary tract infections are simple to treat but a key part of your care is to encourage you to drink plenty of fluids and watch your symptoms carefully.  I have prescribed MacroBid 100 mg twice a day for 5 days.  Your symptoms should gradually improve. Call us if the burning in your urine worsens, you develop worsening fever, back pain or pelvic pain or if your symptoms do not resolve after completing the antibiotic.  Urinary tract infections can be prevented by drinking plenty of water to keep your body hydrated.  Also be sure when you wipe, wipe from front to back and don't hold it in!  If possible, empty your bladder every 4 hours.  Your e-visit answers were reviewed by a board certified advanced clinical practitioner to complete your personal care plan.  Depending on the condition, your plan could have included both over the counter or prescription medications.  If there is a problem please reply  once you have received a response from your provider.  Your safety is important to [place].  If you have drug allergies check your prescription carefully.    You can use MyChart to ask questions about today's visit, request a non-urgent call back, or ask for a work or school excuse for 24 hours related to this e-Visit. If it has been greater than 24 hours you will need to follow up with your provider, or enter a new e-Visit to address those concerns.   You will get an e-mail in the next two days asking about your experience.  I hope that your e-visit has been valuable and will speed your recovery. Thank you for using e-visits.   I spent 5-10 minutes on review and completion of this note- Lacy Duverney [facility]

## 2019-06-20 DIAGNOSIS — N939 Abnormal uterine and vaginal bleeding, unspecified: Secondary | ICD-10-CM | POA: Diagnosis not present

## 2019-06-26 DIAGNOSIS — N939 Abnormal uterine and vaginal bleeding, unspecified: Secondary | ICD-10-CM | POA: Diagnosis not present

## 2019-07-20 ENCOUNTER — Other Ambulatory Visit: Payer: Self-pay | Admitting: Internal Medicine

## 2019-07-20 DIAGNOSIS — J301 Allergic rhinitis due to pollen: Secondary | ICD-10-CM

## 2019-07-20 MED ORDER — FLUTICASONE PROPIONATE 50 MCG/ACT NA SUSP: NASAL | 1 refills | Status: DC

## 2019-09-18 ENCOUNTER — Other Ambulatory Visit: Payer: Self-pay

## 2019-09-18 ENCOUNTER — Encounter: Payer: Self-pay | Admitting: Internal Medicine

## 2019-09-18 ENCOUNTER — Other Ambulatory Visit (INDEPENDENT_AMBULATORY_CARE_PROVIDER_SITE_OTHER): Payer: BC Managed Care – PPO

## 2019-09-18 ENCOUNTER — Ambulatory Visit (INDEPENDENT_AMBULATORY_CARE_PROVIDER_SITE_OTHER): Payer: BC Managed Care – PPO | Admitting: Internal Medicine

## 2019-09-18 VITALS — BP 160/84 | HR 83 | Temp 99.0°F | Resp 16 | Ht 66.0 in | Wt 194.0 lb

## 2019-09-18 DIAGNOSIS — I10 Essential (primary) hypertension: Secondary | ICD-10-CM

## 2019-09-18 DIAGNOSIS — E559 Vitamin D deficiency, unspecified: Secondary | ICD-10-CM | POA: Diagnosis not present

## 2019-09-18 DIAGNOSIS — T148XXA Other injury of unspecified body region, initial encounter: Secondary | ICD-10-CM | POA: Diagnosis not present

## 2019-09-18 DIAGNOSIS — Z0000 Encounter for general adult medical examination without abnormal findings: Secondary | ICD-10-CM | POA: Diagnosis not present

## 2019-09-18 DIAGNOSIS — J301 Allergic rhinitis due to pollen: Secondary | ICD-10-CM | POA: Diagnosis not present

## 2019-09-18 LAB — URINALYSIS, ROUTINE W REFLEX MICROSCOPIC
Bilirubin Urine: NEGATIVE
Nitrite: POSITIVE — AB
Specific Gravity, Urine: 1.025 (ref 1.000–1.030)
Total Protein, Urine: 100 — AB
Urine Glucose: NEGATIVE
Urobilinogen, UA: 0.2 (ref 0.0–1.0)
pH: 6 (ref 5.0–8.0)

## 2019-09-18 LAB — BASIC METABOLIC PANEL
BUN: 13 mg/dL (ref 6–23)
CO2: 26 mEq/L (ref 19–32)
Calcium: 9.2 mg/dL (ref 8.4–10.5)
Chloride: 106 mEq/L (ref 96–112)
Creatinine, Ser: 0.78 mg/dL (ref 0.40–1.20)
GFR: 95.44 mL/min (ref >60.00)
Glucose, Bld: 107 mg/dL — ABNORMAL HIGH (ref 70–99)
Potassium: 3.7 mEq/L (ref 3.5–5.1)
Sodium: 138 mEq/L (ref 135–145)

## 2019-09-18 LAB — CBC WITH DIFFERENTIAL/PLATELET
Basophils Absolute: 0 10*3/uL (ref 0.0–0.1)
Basophils Relative: 0.5 % (ref 0.0–3.0)
Eosinophils Absolute: 0.3 10*3/uL (ref 0.0–0.7)
Eosinophils Relative: 3.5 % (ref 0.0–5.0)
HCT: 37.3 % (ref 36.0–46.0)
Hemoglobin: 12.7 g/dL (ref 12.0–15.0)
Lymphocytes Relative: 36.9 % (ref 12.0–46.0)
Lymphs Abs: 3 10*3/uL (ref 0.7–4.0)
MCHC: 34 g/dL (ref 30.0–36.0)
MCV: 100.4 fl — ABNORMAL HIGH (ref 78.0–100.0)
Monocytes Absolute: 0.6 10*3/uL (ref 0.1–1.0)
Monocytes Relative: 7.5 % (ref 3.0–12.0)
Neutro Abs: 4.2 10*3/uL (ref 1.4–7.7)
Neutrophils Relative %: 51.6 % (ref 43.0–77.0)
Platelets: 360 10*3/uL (ref 150.0–400.0)
RBC: 3.72 Mil/uL — ABNORMAL LOW (ref 3.87–5.11)
RDW: 11.9 % (ref 11.5–15.5)
WBC: 8.2 10*3/uL (ref 4.0–10.5)

## 2019-09-18 LAB — HEPATIC FUNCTION PANEL
ALT: 18 U/L (ref 0–35)
AST: 23 U/L (ref 0–37)
Albumin: 4.3 g/dL (ref 3.5–5.2)
Alkaline Phosphatase: 37 U/L — ABNORMAL LOW (ref 39–117)
Bilirubin, Direct: 0.1 mg/dL (ref 0.0–0.3)
Total Bilirubin: 0.4 mg/dL (ref 0.2–1.2)
Total Protein: 7.2 g/dL (ref 6.0–8.3)

## 2019-09-18 LAB — LIPID PANEL
Cholesterol: 146 mg/dL (ref 0–200)
HDL: 60.8 mg/dL (ref >39.00)
NonHDL: 85.36
Total CHOL/HDL Ratio: 2
Triglycerides: 204 mg/dL — ABNORMAL HIGH (ref 0.0–149.0)
VLDL: 40.8 mg/dL — ABNORMAL HIGH (ref 0.0–40.0)

## 2019-09-18 LAB — PROTIME-INR
INR: 1 ratio (ref 0.8–1.0)
Prothrombin Time: 11.2 s (ref 9.6–13.1)

## 2019-09-18 LAB — APTT: aPTT: 28.1 s (ref 23.4–32.7)

## 2019-09-18 LAB — VITAMIN D 25 HYDROXY (VIT D DEFICIENCY, FRACTURES): VITD: 26.5 ng/mL — ABNORMAL LOW (ref 30.00–100.00)

## 2019-09-18 LAB — TSH: TSH: 0.66 u[IU]/mL (ref 0.35–4.50)

## 2019-09-18 LAB — LDL CHOLESTEROL, DIRECT: Direct LDL: 60 mg/dL

## 2019-09-18 MED ORDER — MONTELUKAST SODIUM 10 MG PO TABS: 10.0000 mg | ORAL_TABLET | Freq: Every day | ORAL | 1 refills | Status: DC

## 2019-09-18 MED ORDER — FLUTICASONE PROPIONATE 50 MCG/ACT NA SUSP: NASAL | 1 refills | Status: DC

## 2019-09-18 MED ORDER — CHLORTHALIDONE 25 MG PO TABS: 25.0000 mg | ORAL_TABLET | Freq: Every day | ORAL | 0 refills | Status: DC

## 2019-09-18 MED ORDER — LEVOCETIRIZINE DIHYDROCHLORIDE 5 MG PO TABS: 5.0000 mg | ORAL_TABLET | Freq: Every evening | ORAL | 1 refills | Status: DC

## 2019-09-18 MED ORDER — CHOLECALCIFEROL 1.25 MG (50000 UT) PO CAPS: 50000.0000 [IU] | ORAL_CAPSULE | ORAL | 1 refills | Status: DC

## 2019-09-18 NOTE — Patient Instructions (Signed)

## 2019-09-18 NOTE — Progress Notes (Signed)
Subjective:  Patient ID: Teresa Blankenship, female    DOB: December 23, 1970  Age: 48 y.o. MRN: [id number]  CC: Annual Exam and Hypertension  This visit occurred during the SARS-CoV-2 public health emergency.  Safety protocols were in place, including screening questions prior to the visit, additional usage of staff PPE, and extensive cleaning of exam room while observing appropriate contact time as indicated for disinfecting solutions.   HPI Teresa Blankenship presents for a CPX.  She complains of spontaneous but asymptomatic bruising on her extremities over the last few months.  A few weeks ago she had bruises on her lower extremities but they resolved.  Today the only bruise she has is the medial aspect of her left upper arm.  She occasionally takes ibuprofen for musculoskeletal pain.  Her menstrual cycles last about 5 days and 3 of those days there is moderate bleeding.  She has not been taking an antihypertensive and has not been monitoring her blood pressure.  She has intermittently been taking Sudafed for nasal congestion.  Outpatient Medications Prior to Visit  Medication Sig Dispense Refill  . MONONESSA 0.25-35 MG-MCG tablet TK 1 T PO  D  11  . fluticasone (FLONASE) 50 MCG/ACT nasal spray SHAKE LIQUID AND USE 2 SPRAYS IN EACH NOSTRIL DAILY 48 g 1  . amoxicillin-clavulanate (AUGMENTIN) 875-125 MG tablet Take 1 tablet by mouth 2 (two) times daily. 20 tablet 0  . chlorthalidone (HYGROTON) 25 MG tablet Take 1 tablet (25 mg total) by mouth daily. (Patient not taking: Reported on 03/29/2019) 90 tablet 0  . Cholecalciferol 50000 units capsule Take 1 capsule (50,000 Units total) by mouth once a week. (Patient not taking: Reported on 09/18/2019) 12 capsule 1  . levocetirizine (XYZAL) 5 MG tablet Take 1 tablet (5 mg total) by mouth every evening. (Patient not taking: Reported on 09/18/2019) 90 tablet 1  . nitrofurantoin, macrocrystal-monohydrate, (MACROBID) 100 MG capsule Take 1 capsule (100 mg total) by  mouth 2 (two) times daily. 10 capsule 0   No facility-administered medications prior to visit.    ROS Review of Systems  Constitutional: Negative for diaphoresis, fatigue and unexpected weight change.  HENT: Positive for congestion, postnasal drip and rhinorrhea. Negative for ear pain, facial swelling, sinus pressure, sinus pain, sneezing, sore throat and tinnitus.   Eyes: Negative.  Negative for visual disturbance.  Respiratory: Negative for cough, chest tightness, shortness of breath and wheezing.   Cardiovascular: Negative for chest pain, palpitations and leg swelling.  Gastrointestinal: Negative for abdominal pain, anal bleeding, blood in stool, diarrhea, nausea and vomiting.  Genitourinary: Negative for difficulty urinating, dysuria, hematuria, vaginal bleeding and vaginal discharge.  Musculoskeletal: Negative.   Skin: Negative.  Negative for color change.  Neurological: Negative.  Negative for dizziness, weakness and light-headedness.  Hematological: Negative for adenopathy. Bruises/bleeds easily.  Psychiatric/Behavioral: Negative.     Objective:  BP (!) 160/84 (BP Location: Left Arm, Patient Position: Sitting, Cuff Size: Normal)   Pulse 83   Temp 99 F (37.2 C) (Oral)   Resp 16   Ht 5' 6"  (1.676 m)   Wt 194 lb (88 kg)   LMP 09/18/2019   SpO2 99%   BMI 31.31 kg/m   BP Readings from Last 3 Encounters:  09/18/19 (!) 160/84  11/21/18 140/86  11/15/18 (!) 135/95    Wt Readings from Last 3 Encounters:  09/18/19 194 lb (88 kg)  11/21/18 189 lb (85.7 kg)  11/15/18 190 lb (86.2 kg)    Physical Exam  Vitals reviewed.  Constitutional:      Appearance: Normal appearance.  HENT:     Nose: Congestion present. No rhinorrhea.     Right Nostril: No epistaxis.     Left Nostril: No epistaxis.     Right Sinus: No maxillary sinus tenderness or frontal sinus tenderness.     Left Sinus: No maxillary sinus tenderness or frontal sinus tenderness.     Mouth/Throat:     Mouth:  Mucous membranes are moist.  Eyes:     General: No scleral icterus.    Conjunctiva/sclera: Conjunctivae normal.  Cardiovascular:     Rate and Rhythm: Normal rate and regular rhythm.     Heart sounds: No murmur.  Pulmonary:     Effort: Pulmonary effort is normal.     Breath sounds: No stridor. No wheezing, rhonchi or rales.  Abdominal:     General: Abdomen is flat. Bowel sounds are normal.     Palpations: There is no hepatomegaly or splenomegaly.     Tenderness: There is no abdominal tenderness.  Musculoskeletal:        General: Normal range of motion.     Cervical back: Neck supple.     Right lower leg: No edema.     Left lower leg: No edema.  Lymphadenopathy:     Cervical: No cervical adenopathy.  Skin:    General: Skin is warm and dry.     Coloration: Skin is not pale.     Findings: Bruising present. No rash.     Comments: Left medial mid upper arm there is an ecchymosis the size of a thumbprint.  See photo.  Neurological:     General: No focal deficit present.     Mental Status: She is alert.  Psychiatric:        Mood and Affect: Mood normal.        Behavior: Behavior normal.     Lab Results  Component Value Date   WBC 8.2 09/18/2019   HGB 12.7 09/18/2019   HCT 37.3 09/18/2019   PLT 360.0 09/18/2019   GLUCOSE 107 (H) 09/18/2019   CHOL 146 09/18/2019   TRIG 204.0 (H) 09/18/2019   HDL 60.80 09/18/2019   LDLDIRECT 60.0 09/18/2019   ALT 18 09/18/2019   AST 23 09/18/2019   NA 138 09/18/2019   K 3.7 09/18/2019   CL 106 09/18/2019   CREATININE 0.78 09/18/2019   BUN 13 09/18/2019   CO2 26 09/18/2019   TSH 0.66 09/18/2019   INR 1.0 09/18/2019   HGBA1C 4.7 06/27/2018    Mr Cervical Spine W/o Contrast  Result Date: 05/08/2017 CLINICAL DATA:  48 y/o  F; neck pain radicular arm pain. EXAM: MRI CERVICAL SPINE WITHOUT CONTRAST TECHNIQUE: Multiplanar, multisequence MR imaging of the cervical spine was performed. No intravenous contrast was administered. COMPARISON:   04/26/2017 cervical spine radiographs. FINDINGS: Alignment: Straightening of cervical lordosis without listhesis. Vertebrae: No fracture, evidence of discitis, or bone lesion. Cord: Normal signal and morphology. Posterior Fossa, vertebral arteries, paraspinal tissues: Negative. Disc levels: C2-3: No significant disc displacement, foraminal narrowing, or canal stenosis. C3-4: Small disc bulge eccentric to the left with mild left foraminal stenosis. No significant canal stenosis. C4-5: Small disc osteophyte complex with prominent right-sided uncovertebral hypertrophy. Mild left and moderate to severe right foraminal stenosis. No significant canal stenosis. C5-6: Moderate disc osteophyte complex with bilateral uncovertebral and facet hypertrophy. Moderate bilateral foraminal stenosis and mild canal stenosis with anterior cord impingement and flattening. C6-7: Disc osteophyte complex with  left-greater-than-right uncovertebral and facet hypertrophy. Moderate to severe left and mild right foraminal stenosis. Right central 6 mm disc protrusion impinges the right anterior cord with cord flattening and moderate to severe canal stenosis. C7-T1: No significant disc displacement, foraminal narrowing, or canal stenosis. IMPRESSION: 1. C6-7 right central disc protrusion with right anterior cord impingement, cord flattening, and moderate to severe canal stenosis. 2. Multiple levels of mild foraminal stenosis with moderate to severe right C4-5, moderate bilateral C5-6, and moderate to severe left C6-7 foraminal stenosis. 3. No acute osseous abnormality.  No abnormal cord signal. Electronically Signed   By: Kristine Garbe M.D.   On: 05/08/2017 01:50    Assessment & Plan:   Josefa was seen today for annual exam and hypertension.  Diagnoses and all orders for this visit:  Essential hypertension- Her blood pressure is not adequately well controlled.  She agrees to stop taking the Sudafed.  I will upgrade her  treatment for allergic rhinitis.  She agrees to start taking chlorthalidone again to get better control of her blood pressure. -     Basic metabolic panel; Future -     TSH; Future -     Urinalysis, Routine w reflex microscopic; Future -     Hepatic function panel; Future -     chlorthalidone (HYGROTON) 25 MG tablet; Take 1 tablet (25 mg total) by mouth daily.  Routine general medical examination at a health care facility- Exam completed, labs reviewed - Her ASCVD risk score is less than 10% so I did not recommend a statin for CV risk reduction, she refused a flu vaccine, screening for breast and cervical cancer is up-to-date, patient education was given. -     Lipid panel; Future  Vitamin D deficiency disease -     Vitamin D 25 hydroxy; Future -     Cholecalciferol 1.25 MG (50000 UT) capsule; Take 1 capsule (50,000 Units total) by mouth once a week.  Superficial bruising- Work-up for secondary causes of bleeding is unremarkable.  I think her bleeding is benign and is related to the use of ibuprofen.  I have asked her to avoid anti-inflammatories. -     CBC with Differential; Future -     APTT; Future -     Protime-INR; Future -     Hepatic function panel; Future  Seasonal allergic rhinitis due to pollen -     levocetirizine (XYZAL) 5 MG tablet; Take 1 tablet (5 mg total) by mouth every evening. -     fluticasone (FLONASE) 50 MCG/ACT nasal spray; SHAKE LIQUID AND USE 2 SPRAYS IN EACH NOSTRIL DAILY -     montelukast (SINGULAIR) 10 MG tablet; Take 1 tablet (10 mg total) by mouth at bedtime.   I have discontinued Roschelle D. Knock's amoxicillin-clavulanate and nitrofurantoin (macrocrystal-monohydrate). I have also changed her Cholecalciferol. Additionally, I am having her start on montelukast. Lastly, I am having her maintain her MonoNessa, levocetirizine, fluticasone, and chlorthalidone.  Meds ordered this encounter  Medications  . levocetirizine (XYZAL) 5 MG tablet    Sig: Take 1 tablet  (5 mg total) by mouth every evening.    Dispense:  90 tablet    Refill:  1  . fluticasone (FLONASE) 50 MCG/ACT nasal spray    Sig: SHAKE LIQUID AND USE 2 SPRAYS IN EACH NOSTRIL DAILY    Dispense:  48 g    Refill:  1  . chlorthalidone (HYGROTON) 25 MG tablet    Sig: Take 1 tablet (25 mg total) by  mouth daily.    Dispense:  90 tablet    Refill:  0  . montelukast (SINGULAIR) 10 MG tablet    Sig: Take 1 tablet (10 mg total) by mouth at bedtime.    Dispense:  90 tablet    Refill:  1  . Cholecalciferol 1.25 MG (50000 UT) capsule    Sig: Take 1 capsule (50,000 Units total) by mouth once a week.    Dispense:  12 capsule    Refill:  1     Follow-up: Return in about 3 months (around 12/17/2019).  Scarlette Calico, MD

## 2019-09-30 ENCOUNTER — Encounter: Payer: Self-pay | Admitting: Internal Medicine

## 2019-10-01 ENCOUNTER — Other Ambulatory Visit: Payer: Self-pay | Admitting: Internal Medicine

## 2019-10-01 DIAGNOSIS — I10 Essential (primary) hypertension: Secondary | ICD-10-CM

## 2019-10-01 MED ORDER — INDAPAMIDE 1.25 MG PO TABS: 1.2500 mg | ORAL_TABLET | Freq: Every day | ORAL | 0 refills | Status: DC

## 2019-10-01 NOTE — Telephone Encounter (Signed)
Do you want to see patient for labs? Let me know.

## 2019-12-21 ENCOUNTER — Ambulatory Visit: Payer: BC Managed Care – PPO | Attending: Internal Medicine

## 2019-12-21 DIAGNOSIS — Z23 Encounter for immunization: Secondary | ICD-10-CM

## 2019-12-21 NOTE — Progress Notes (Signed)
   Covid-19 Vaccination Clinic  Name:  Teresa Blankenship    MRN: [id number] DOB: 1971/09/08  12/21/2019  Teresa Blankenship was observed post Covid-19 immunization for 15 minutes without incident. She was provided with Vaccine Information Sheet and instruction to access the V-Safe system.   Teresa Blankenship was instructed to call 911 with any severe reactions post vaccine: Marland Kitchen Difficulty breathing  . Swelling of face and throat  . A fast heartbeat  . A bad rash all over body  . Dizziness and weakness   Immunizations Administered    Name Date Dose VIS Date Route   Pfizer COVID-19 Vaccine 12/21/2019 11:26 AM 0.3 mL 09/20/2019 Intramuscular   Manufacturer: [place]   Lot: [id number]   [place]: [id number]

## 2019-12-29 ENCOUNTER — Telehealth: Payer: BC Managed Care – PPO | Admitting: Physician Assistant

## 2019-12-29 DIAGNOSIS — J019 Acute sinusitis, unspecified: Secondary | ICD-10-CM

## 2019-12-29 MED ORDER — FLUTICASONE PROPIONATE 50 MCG/ACT NA SUSP: 2.0000 | Freq: Every day | NASAL | 6 refills | Status: DC

## 2019-12-29 MED ORDER — LORATADINE 10 MG PO TABS: 10.0000 mg | ORAL_TABLET | Freq: Every day | ORAL | 0 refills | Status: DC

## 2019-12-29 NOTE — Progress Notes (Signed)
We are sorry that you are not feeling well.  Here is how we plan to help! ` Based on what you have shared with me it looks like you have sinusitis.  Sinusitis is inflammation and infection in the sinus cavities of the head.  Based on your presentation I believe you most likely have Acute Viral Sinusitis.This is an infection most likely caused by a virus. There is not specific treatment for viral sinusitis other than to help you with the symptoms until the infection runs its course.  You may use an oral decongestant such as Mucinex D or if you have glaucoma or high blood pressure use plain Mucinex. Saline nasal spray help and can safely be used as often as needed for congestion, I have prescribed: Fluticasone nasal spray two sprays in each nostril once a day. I have also prescribed Claritin 10 mg as well.   Some authorities believe that zinc sprays or the use of Echinacea may shorten the course of your symptoms.  Sinus infections are not as easily transmitted as other respiratory infection, however we still recommend that you avoid close contact with loved ones, especially the very young and elderly.  Remember to wash your hands thoroughly throughout the day as this is the number one way to prevent the spread of infection!  Home Care:  Only take medications as instructed by your medical team.  Do not take these medications with alcohol.  A steam or ultrasonic humidifier can help congestion.  You can place a towel over your head and breathe in the steam from hot water coming from a faucet.  Avoid close contacts especially the very young and the elderly.  Cover your mouth when you cough or sneeze.  Always remember to wash your hands.  Get Help Right Away If:  You develop worsening fever or sinus pain.  You develop a severe head ache or visual changes.  Your symptoms persist after you have completed your treatment plan.  Make sure you  Understand these instructions.  Will watch your  condition.  Will get help right away if you are not doing well or get worse.  Your e-visit answers were reviewed by a board certified advanced clinical practitioner to complete your personal care plan.  Depending on the condition, your plan could have included both over the counter or prescription medications.  If there is a problem please reply  once you have received a response from your provider.  Your safety is important to [place].  If you have drug allergies check your prescription carefully.    You can use MyChart to ask questions about today's visit, request a non-urgent call back, or ask for a work or school excuse for 24 hours related to this e-Visit. If it has been greater than 24 hours you will need to follow up with your provider, or enter a new e-Visit to address those concerns.  You will get an e-mail in the next two days asking about your experience.  I hope that your e-visit has been valuable and will speed your recovery. Thank you for using e-visits.   Greater than 5 minutes, yet less than 10 minutes of time have been spent researching, coordinating, and implementing care for this patient today

## 2019-12-31 DIAGNOSIS — N939 Abnormal uterine and vaginal bleeding, unspecified: Secondary | ICD-10-CM | POA: Diagnosis not present

## 2019-12-31 DIAGNOSIS — Z6831 Body mass index (BMI) 31.0-31.9, adult: Secondary | ICD-10-CM | POA: Diagnosis not present

## 2019-12-31 DIAGNOSIS — Z1231 Encounter for screening mammogram for malignant neoplasm of breast: Secondary | ICD-10-CM | POA: Diagnosis not present

## 2019-12-31 DIAGNOSIS — Z01419 Encounter for gynecological examination (general) (routine) without abnormal findings: Secondary | ICD-10-CM | POA: Diagnosis not present

## 2020-01-13 ENCOUNTER — Ambulatory Visit: Payer: BC Managed Care – PPO

## 2020-01-13 ENCOUNTER — Ambulatory Visit: Payer: BC Managed Care – PPO | Attending: Internal Medicine

## 2020-01-13 DIAGNOSIS — Z23 Encounter for immunization: Secondary | ICD-10-CM

## 2020-01-13 NOTE — Progress Notes (Signed)
   Covid-19 Vaccination Clinic  Name:  Teresa Blankenship    MRN: [id number] DOB: [birth year]  01/13/2020  Teresa Blankenship was observed post Covid-19 immunization for 15 minutes without incident. She was provided with Vaccine Information Sheet and instruction to access the V-Safe system.   Teresa Blankenship was instructed to call 911 with any severe reactions post vaccine: Marland Kitchen Difficulty breathing  . Swelling of face and throat  . A fast heartbeat  . A bad rash all over body  . Dizziness and weakness   Immunizations Administered    Name Date Dose VIS Date Route   Pfizer COVID-19 Vaccine 01/13/2020  4:29 PM 0.3 mL 09/20/2019 Intramuscular   Manufacturer: [place]   Lot: [id number]   [place]: [id number]

## 2020-02-06 ENCOUNTER — Other Ambulatory Visit: Payer: Self-pay

## 2020-02-06 ENCOUNTER — Encounter: Payer: Self-pay | Admitting: Internal Medicine

## 2020-02-06 ENCOUNTER — Ambulatory Visit (INDEPENDENT_AMBULATORY_CARE_PROVIDER_SITE_OTHER): Payer: BC Managed Care – PPO

## 2020-02-06 ENCOUNTER — Ambulatory Visit (INDEPENDENT_AMBULATORY_CARE_PROVIDER_SITE_OTHER): Payer: BC Managed Care – PPO | Admitting: Internal Medicine

## 2020-02-06 ENCOUNTER — Other Ambulatory Visit: Payer: Self-pay | Admitting: Internal Medicine

## 2020-02-06 VITALS — BP 140/80 | HR 79 | Temp 98.2°F | Ht 66.0 in | Wt 197.0 lb

## 2020-02-06 DIAGNOSIS — M1611 Unilateral primary osteoarthritis, right hip: Secondary | ICD-10-CM | POA: Diagnosis not present

## 2020-02-06 DIAGNOSIS — M25551 Pain in right hip: Secondary | ICD-10-CM

## 2020-02-06 MED ORDER — RELAFEN DS 1000 MG PO TABS: 2.0000 | ORAL_TABLET | Freq: Every day | ORAL | 0 refills | Status: DC | PRN

## 2020-02-06 NOTE — Progress Notes (Signed)
Subjective:  Patient ID: Teresa Blankenship, female    DOB: 1970/11/01  Age: 49 y.o. MRN: [id number]  CC: Hip Pain (Right thigh pain)  This visit occurred during the SARS-CoV-2 public health emergency.  Safety protocols were in place, including screening questions prior to the visit, additional usage of staff PPE, and extensive cleaning of exam room while observing appropriate contact time as indicated for disinfecting solutions.    HPI Teresa Blankenship presents for the complaint of a 2-week history of right hip pain.  The pain is a diffuse throbbing and aching sensation.  She denies any trauma or injury.  She has had some pain in her other large joints bother her as well.  She denies back pain and has no discomfort that radiates towards her extremities.  She denies paresthesias.  She has not taken anything for the discomfort.  Outpatient Medications Prior to Visit  Medication Sig Dispense Refill  . Cholecalciferol 1.25 MG (50000 UT) capsule Take 1 capsule (50,000 Units total) by mouth once a week. 12 capsule 1  . fluticasone (FLONASE) 50 MCG/ACT nasal spray SHAKE LIQUID AND USE 2 SPRAYS IN EACH NOSTRIL DAILY 48 g 1  . fluticasone (FLONASE) 50 MCG/ACT nasal spray Place 2 sprays into both nostrils daily. 16 g 6  . indapamide (LOZOL) 1.25 MG tablet Take 1 tablet (1.25 mg total) by mouth daily. 90 tablet 0  . levocetirizine (XYZAL) 5 MG tablet Take 1 tablet (5 mg total) by mouth every evening. 90 tablet 1  . loratadine (CLARITIN) 10 MG tablet Take 1 tablet (10 mg total) by mouth daily. 30 tablet 0  . MONONESSA 0.25-35 MG-MCG tablet TK 1 T PO  D  11  . montelukast (SINGULAIR) 10 MG tablet Take 1 tablet (10 mg total) by mouth at bedtime. 90 tablet 1   No facility-administered medications prior to visit.    ROS Review of Systems  Constitutional: Negative for chills, fatigue and fever.  HENT: Negative.   Eyes: Negative.   Respiratory: Negative for chest tightness, shortness of breath and wheezing.    Cardiovascular: Negative for chest pain, palpitations and leg swelling.  Gastrointestinal: Negative for abdominal pain, constipation, diarrhea, nausea and vomiting.  Endocrine: Negative.   Genitourinary: Negative.  Negative for difficulty urinating.  Musculoskeletal: Positive for arthralgias. Negative for back pain and myalgias.  Skin: Negative.  Negative for color change.  Neurological: Negative.  Negative for dizziness and weakness.  Hematological: Negative for adenopathy. Does not bruise/bleed easily.  Psychiatric/Behavioral: Negative.     Objective:  BP 140/80 (BP Location: Left Arm, Patient Position: Sitting, Cuff Size: Large)   Pulse 79   Temp 98.2 F (36.8 C) (Oral)   Ht 5' 6"  (1.676 m)   Wt 197 lb (89.4 kg)   LMP 01/09/2020   SpO2 99%   BMI 31.80 kg/m   BP Readings from Last 3 Encounters:  02/06/20 140/80  09/18/19 (!) 160/84  11/21/18 140/86    Wt Readings from Last 3 Encounters:  02/06/20 197 lb (89.4 kg)  09/18/19 194 lb (88 kg)  11/21/18 189 lb (85.7 kg)    Physical Exam Vitals reviewed.  Constitutional:      Appearance: Normal appearance.  HENT:     Nose: Nose normal.     Mouth/Throat:     Mouth: Mucous membranes are moist.  Eyes:     General: No scleral icterus.    Conjunctiva/sclera: Conjunctivae normal.  Cardiovascular:     Rate and Rhythm: Normal rate and  regular rhythm.     Heart sounds: No murmur.  Pulmonary:     Effort: Pulmonary effort is normal.     Breath sounds: No stridor. No wheezing, rhonchi or rales.  Abdominal:     General: Abdomen is flat. Bowel sounds are normal. There is no distension.     Palpations: Abdomen is soft. There is no hepatomegaly, splenomegaly or mass.     Tenderness: There is no abdominal tenderness.  Musculoskeletal:        General: No swelling, deformity or signs of injury.     Cervical back: Normal and neck supple.     Thoracic back: Normal.     Lumbar back: Normal. No swelling. Normal range of motion.  Negative right straight leg raise test and negative left straight leg raise test.     Right hip: Tenderness present. No deformity or bony tenderness. Normal range of motion. Normal strength.     Left hip: Normal. No deformity, tenderness or bony tenderness. Normal range of motion.     Right lower leg: No swelling. No edema.     Left lower leg: No swelling. No edema.  Lymphadenopathy:     Cervical: No cervical adenopathy.  Skin:    General: Skin is warm.  Neurological:     General: No focal deficit present.     Mental Status: She is alert.     Lab Results  Component Value Date   WBC 8.2 09/18/2019   HGB 12.7 09/18/2019   HCT 37.3 09/18/2019   PLT 360.0 09/18/2019   GLUCOSE 107 (H) 09/18/2019   CHOL 146 09/18/2019   TRIG 204.0 (H) 09/18/2019   HDL 60.80 09/18/2019   LDLDIRECT 60.0 09/18/2019   ALT 18 09/18/2019   AST 23 09/18/2019   NA 138 09/18/2019   K 3.7 09/18/2019   CL 106 09/18/2019   CREATININE 0.78 09/18/2019   BUN 13 09/18/2019   CO2 26 09/18/2019   TSH 0.66 09/18/2019   INR 1.0 09/18/2019   HGBA1C 4.7 06/27/2018    MR Cervical Spine w/o contrast  Result Date: 05/08/2017 CLINICAL DATA:  49 y/o  F; neck pain radicular arm pain. EXAM: MRI CERVICAL SPINE WITHOUT CONTRAST TECHNIQUE: Multiplanar, multisequence MR imaging of the cervical spine was performed. No intravenous contrast was administered. COMPARISON:  04/26/2017 cervical spine radiographs. FINDINGS: Alignment: Straightening of cervical lordosis without listhesis. Vertebrae: No fracture, evidence of discitis, or bone lesion. Cord: Normal signal and morphology. Posterior Fossa, vertebral arteries, paraspinal tissues: Negative. Disc levels: C2-3: No significant disc displacement, foraminal narrowing, or canal stenosis. C3-4: Small disc bulge eccentric to the left with mild left foraminal stenosis. No significant canal stenosis. C4-5: Small disc osteophyte complex with prominent right-sided uncovertebral hypertrophy.  Mild left and moderate to severe right foraminal stenosis. No significant canal stenosis. C5-6: Moderate disc osteophyte complex with bilateral uncovertebral and facet hypertrophy. Moderate bilateral foraminal stenosis and mild canal stenosis with anterior cord impingement and flattening. C6-7: Disc osteophyte complex with left-greater-than-right uncovertebral and facet hypertrophy. Moderate to severe left and mild right foraminal stenosis. Right central 6 mm disc protrusion impinges the right anterior cord with cord flattening and moderate to severe canal stenosis. C7-T1: No significant disc displacement, foraminal narrowing, or canal stenosis. IMPRESSION: 1. C6-7 right central disc protrusion with right anterior cord impingement, cord flattening, and moderate to severe canal stenosis. 2. Multiple levels of mild foraminal stenosis with moderate to severe right C4-5, moderate bilateral C5-6, and moderate to severe left C6-7 foraminal stenosis. 3.  No acute osseous abnormality.  No abnormal cord signal. Electronically Signed   By: Kristine Garbe M.D.   On: 05/08/2017 01:50   DG HIP UNILAT WITH PELVIS 2-3 VIEWS RIGHT  Result Date: 02/06/2020 CLINICAL DATA:  Hip pain for 2 weeks, no known injury, initial encounter EXAM: DG HIP (WITH OR WITHOUT PELVIS) 2V RIGHT COMPARISON:  None. FINDINGS: Pelvic ring is intact. Degenerative changes of the right hip joint are noted. No acute fracture or dislocation is seen. IMPRESSION: No acute abnormality noted.  Degenerative changes are seen. Electronically Signed   By: Inez Catalina M.D.   On: 02/06/2020 15:33    Assessment & Plan:   Ethyle was seen today for hip pain.  Diagnoses and all orders for this visit:  Hip pain, acute, right - Based on the examination and plain films I think her discomfort is related to osteoarthritis.  I recommended that she start taking an anti-inflammatory. -     Cancel: DG Hip Unilat W OR W/O Pelvis Min 4 Views Right;  Future  Primary osteoarthritis of right hip -     Nabumetone (RELAFEN DS) 1000 MG TABS; Take 2 tablets by mouth daily as needed.   I am having Chantel D. Urbanik start on Relafen DS. I am also having her maintain her MonoNessa, levocetirizine, fluticasone, montelukast, Cholecalciferol, indapamide, fluticasone, and loratadine.  Meds ordered this encounter  Medications  . Nabumetone (RELAFEN DS) 1000 MG TABS    Sig: Take 2 tablets by mouth daily as needed.    Dispense:  8 tablet    Refill:  0     Follow-up: Return in about 3 months (around 05/07/2020).  Scarlette Calico, MD

## 2020-02-06 NOTE — Patient Instructions (Signed)

## 2020-02-17 ENCOUNTER — Telehealth: Payer: Self-pay | Admitting: Internal Medicine

## 2020-02-17 DIAGNOSIS — M1611 Unilateral primary osteoarthritis, right hip: Secondary | ICD-10-CM

## 2020-02-18 ENCOUNTER — Telehealth: Payer: Self-pay | Admitting: Internal Medicine

## 2020-02-18 DIAGNOSIS — M1611 Unilateral primary osteoarthritis, right hip: Secondary | ICD-10-CM

## 2020-02-18 MED ORDER — RELAFEN DS 1000 MG PO TABS: 2.0000 | ORAL_TABLET | Freq: Every day | ORAL | 0 refills | Status: DC | PRN

## 2020-02-18 NOTE — Telephone Encounter (Signed)
erx has been sent as requested.

## 2020-02-18 NOTE — Telephone Encounter (Signed)
New message:   1.Medication Requested: Nabumetone (RELAFEN DS) 1000 MG TABS 2. Pharmacy (Name, Street, [place]): [facility] 3. On Med List: Yes  4. Last Visit with PCP: 02/06/20  5. Next visit date with PCP: None   Agent: Please be advised that RX refills may take up to 3 business days. We ask that you follow-up with your pharmacy.

## 2020-02-19 NOTE — Telephone Encounter (Signed)
New message:   Pt is calling and asking for [place] to send the request again to [facility] for they are stating they haven't received it. Please advise.

## 2020-02-20 ENCOUNTER — Telehealth: Payer: Self-pay

## 2020-02-20 MED ORDER — RELAFEN DS 1000 MG PO TABS: 1.0000 | ORAL_TABLET | Freq: Every day | ORAL | 0 refills | Status: DC | PRN

## 2020-02-20 NOTE — Addendum Note (Signed)
Addended by: Aviva Signs M on: 02/20/2020 03:57 PM   Modules accepted: Orders

## 2020-02-20 NOTE — Telephone Encounter (Signed)
F/u  The patient voiced she just spoken to [facility] they do not have a prescription for the patient  The patient is informed medication was sent over on 5.11.2021   Asking for a callback today

## 2020-02-20 NOTE — Telephone Encounter (Signed)
New message    The cost of Nabumetone (RELAFEN DS) 1000 MG TABS $ 1,657.54 looking alternative genertic brand.   The patient is aware that Dr. Ronnald Ramp is out of the office on vacation   Teresa Blankenship, Teresa Blankenship

## 2020-02-20 NOTE — Telephone Encounter (Addendum)
Starting prior authorization for Relafen - Key: [id number]  PA has been approved.   Pt contacted and informed of same.   Pharmacy contacted and informed of same a well.

## 2020-02-21 ENCOUNTER — Other Ambulatory Visit: Payer: Self-pay | Admitting: Internal Medicine

## 2020-02-21 DIAGNOSIS — M1611 Unilateral primary osteoarthritis, right hip: Secondary | ICD-10-CM

## 2020-02-21 MED ORDER — NABUMETONE 500 MG PO TABS: 500.0000 mg | ORAL_TABLET | Freq: Two times a day (BID) | ORAL | 1 refills | Status: DC | PRN

## 2020-02-24 NOTE — Telephone Encounter (Addendum)
Pt contacted and informed an alternative has been sent in (relafen 500 mg bid).   Pt informed to call us back if she has any issures picking up the new strength.

## 2020-04-17 ENCOUNTER — Telehealth: Payer: BC Managed Care – PPO | Admitting: Emergency Medicine

## 2020-04-17 DIAGNOSIS — R300 Dysuria: Secondary | ICD-10-CM

## 2020-04-17 MED ORDER — CEPHALEXIN 500 MG PO CAPS: 500.0000 mg | ORAL_CAPSULE | Freq: Two times a day (BID) | ORAL | 0 refills | Status: DC

## 2020-04-17 NOTE — Progress Notes (Signed)
We are sorry that you are not feeling well.  Here is how we plan to help!  Based on what you shared with me it looks like you most likely have a simple urinary tract infection.  A UTI (Urinary Tract Infection) is a bacterial infection of the bladder.  Most cases of urinary tract infections are simple to treat but a key part of your care is to encourage you to drink plenty of fluids and watch your symptoms carefully.  I have prescribed Keflex 500 mg twice a day for 7 days.  Your symptoms should gradually improve. Call us if the burning in your urine worsens, you develop worsening fever, back pain or pelvic pain or if your symptoms do not resolve after completing the antibiotic.  Urinary tract infections can be prevented by drinking plenty of water to keep your body hydrated.  Also be sure when you wipe, wipe from front to back and don't hold it in!  If possible, empty your bladder every 4 hours.  Your e-visit answers were reviewed by a board certified advanced clinical practitioner to complete your personal care plan.  Depending on the condition, your plan could have included both over the counter or prescription medications.  If there is a problem please reply  once you have received a response from your provider.  Your safety is important to [place].  If you have drug allergies check your prescription carefully.    You can use MyChart to ask questions about today's visit, request a non-urgent call back, or ask for a work or school excuse for 24 hours related to this e-Visit. If it has been greater than 24 hours you will need to follow up with your provider, or enter a new e-Visit to address those concerns.   You will get an e-mail in the next two days asking about your experience.  I hope that your e-visit has been valuable and will speed your recovery. Thank you for using e-visits.   Approximately 5 minutes was used in reviewing the patient's chart, questionnaire, prescribing medications, and  documentation.

## 2020-04-22 ENCOUNTER — Telehealth: Payer: Self-pay

## 2020-04-22 NOTE — Telephone Encounter (Signed)
Pt contacted and informed that Dr. Ronnald Ramp did not rx the medication so she will need to contact the provider that did rx it if she feels she needs something different.   I did inform pt that diarrhea is common when taking an antibiotic.

## 2020-04-22 NOTE — Telephone Encounter (Signed)
New message   Pt c/o medication issue:  1. Name of Medication: cephALEXin (KEFLEX) 500 MG capsule  2. How are you currently taking this medication (dosage and times per day)? Twice a day for 7 days    3. Are you having a reaction (difficulty breathing--STAT)? No   4. What is your medication issue?  Diarrhea looking for something different  5. Teresa Blankenship, [place]

## 2020-06-01 IMAGING — DX DG HIP (WITH OR WITHOUT PELVIS) 2-3V*R*
2 series · 2 of 2 positions shown · non-contrast
Comparison: None.

CLINICAL DATA: Hip pain for 2 weeks, no known injury, initial
encounter

EXAM:
DG HIP (WITH OR WITHOUT PELVIS) 2V RIGHT

[hip ap]
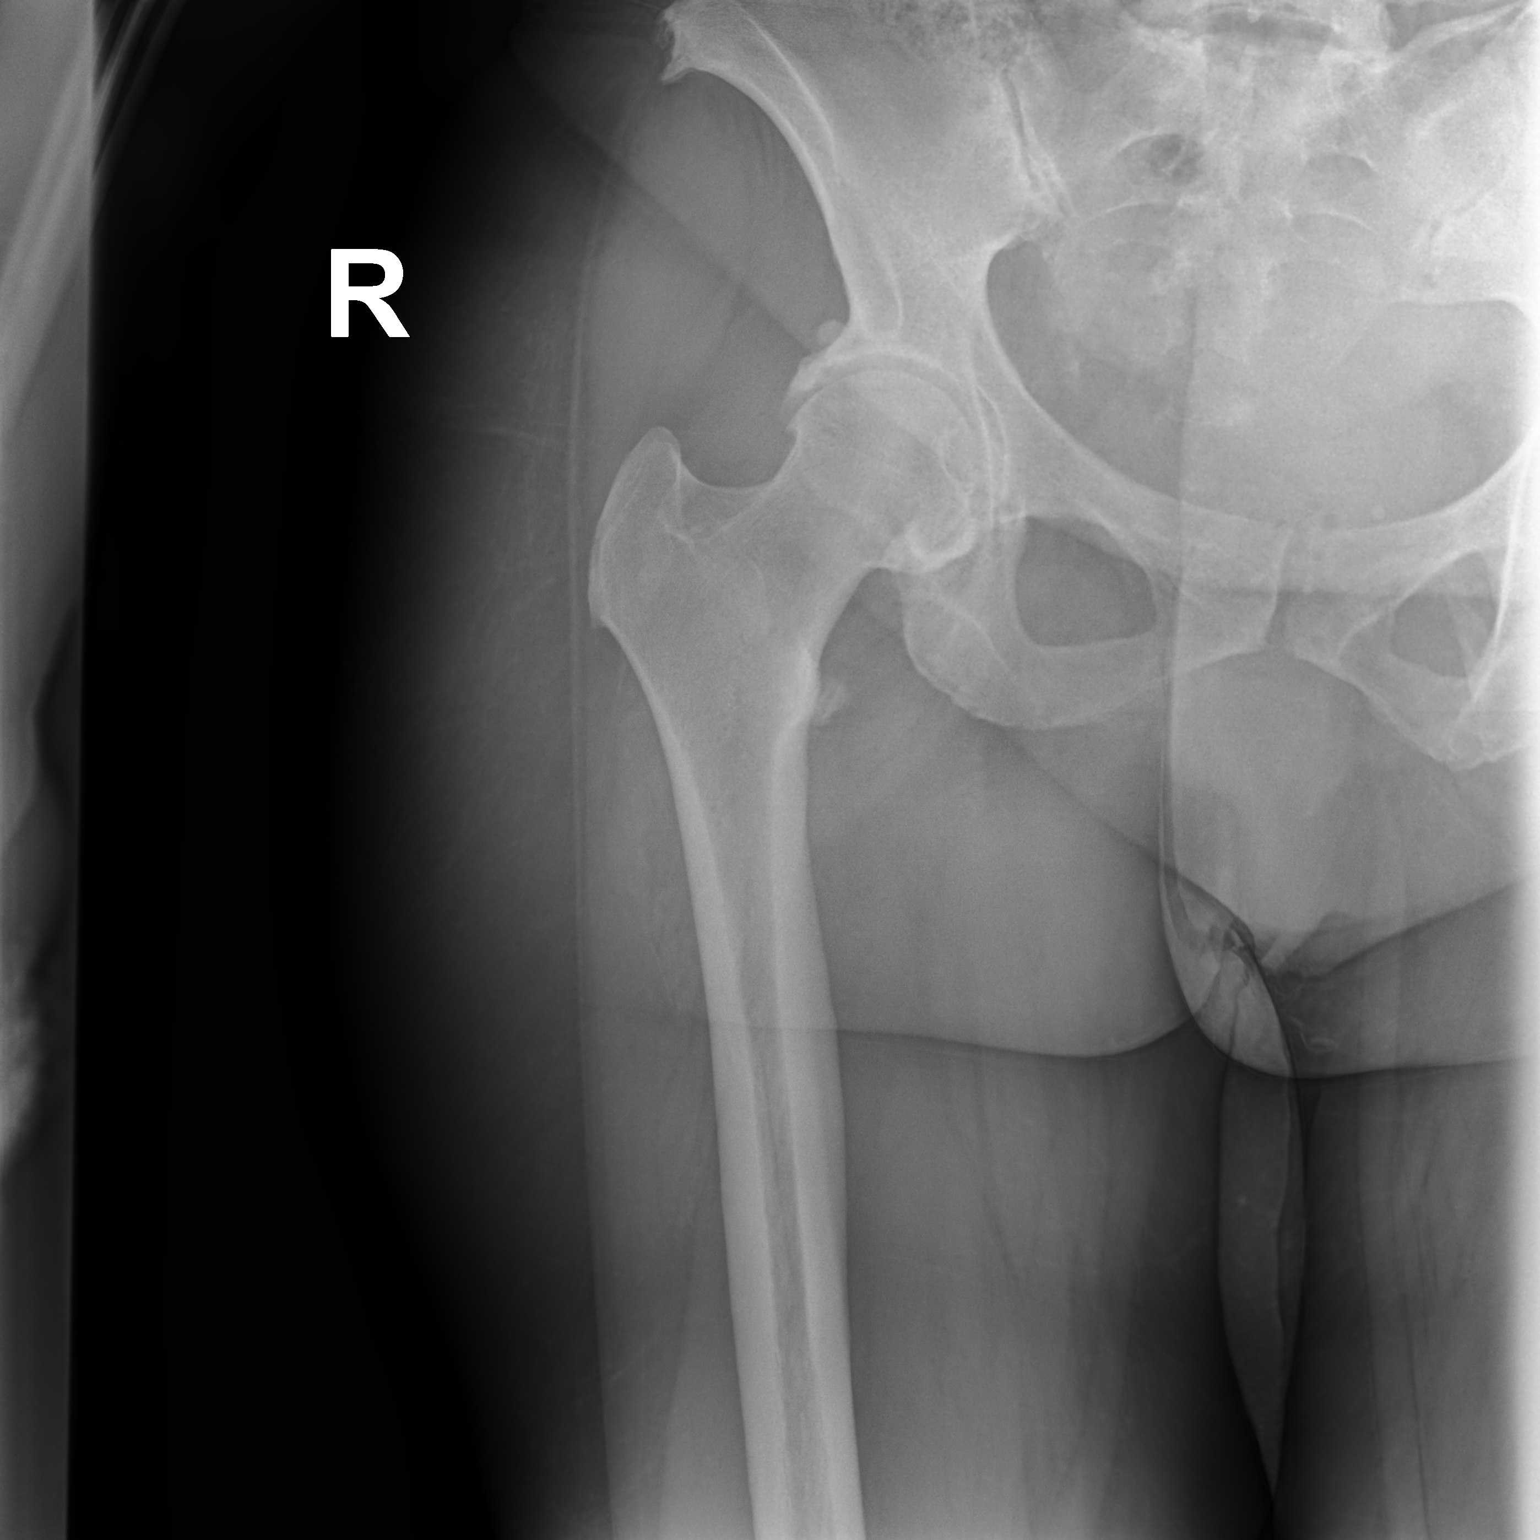

[hip (frog leg)]
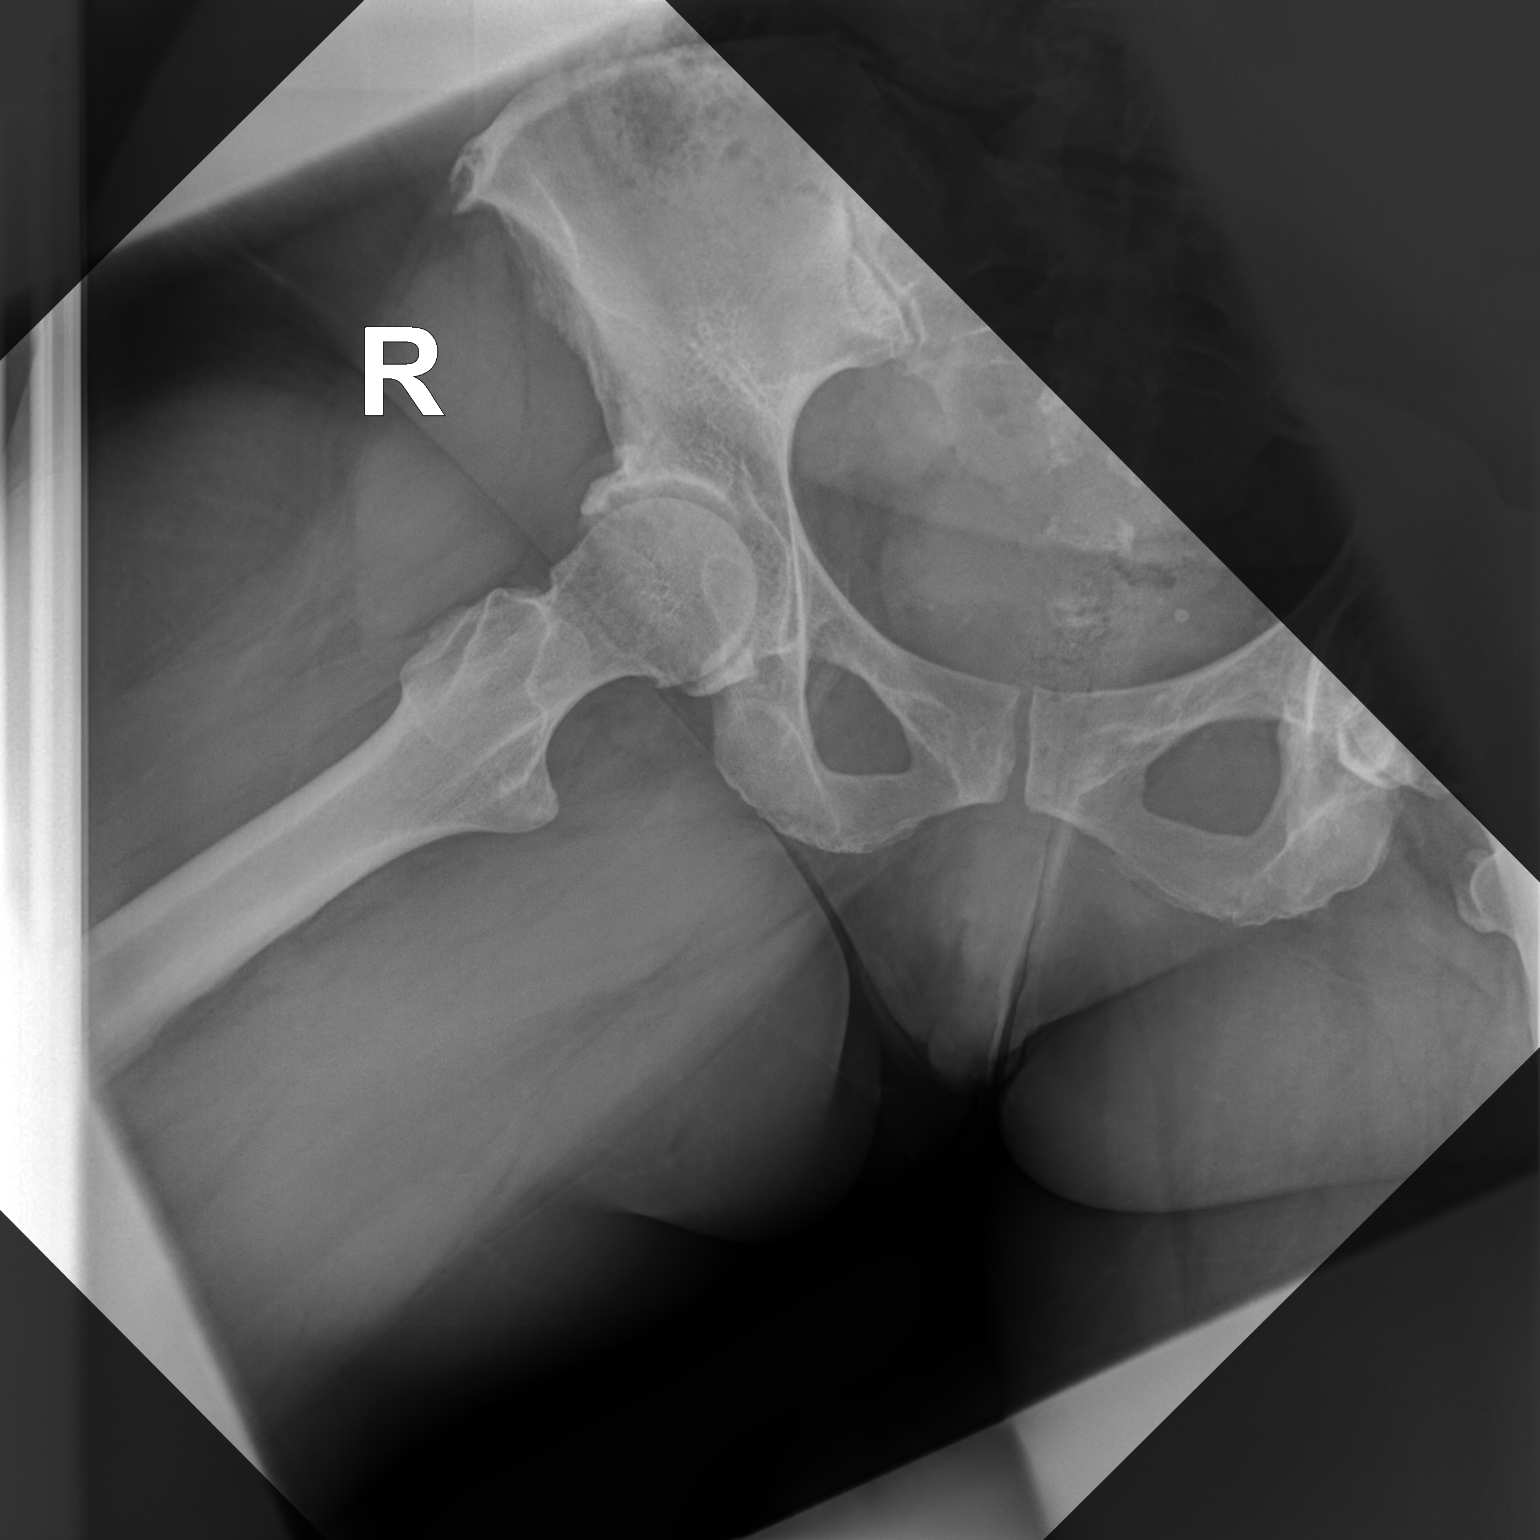

[2 of 2 positions shown; findings below may reference images not displayed]

FINDINGS: Pelvic ring is intact. Degenerative changes of the right hip joint
are noted. No acute fracture or dislocation is seen.
IMPRESSION: No acute abnormality noted.  Degenerative changes are seen.

## 2020-06-29 ENCOUNTER — Ambulatory Visit: Payer: BC Managed Care – PPO | Admitting: Podiatry

## 2020-07-17 ENCOUNTER — Ambulatory Visit: Payer: BC Managed Care – PPO | Admitting: Physician Assistant

## 2020-07-21 ENCOUNTER — Telehealth: Payer: BC Managed Care – PPO | Admitting: Physician Assistant

## 2020-07-21 DIAGNOSIS — J019 Acute sinusitis, unspecified: Secondary | ICD-10-CM | POA: Diagnosis not present

## 2020-07-21 MED ORDER — AZELASTINE HCL 0.1 % NA SOLN: 2.0000 | Freq: Two times a day (BID) | NASAL | 12 refills | Status: DC

## 2020-07-21 MED ORDER — PREDNISONE 20 MG PO TABS: 40.0000 mg | ORAL_TABLET | Freq: Every day | ORAL | 0 refills | Status: AC

## 2020-07-21 NOTE — Addendum Note (Signed)
Addended by: Madilyn Hook A on: 07/21/2020 02:21 PM   Modules accepted: Orders

## 2020-07-21 NOTE — Progress Notes (Signed)
We are sorry that you are not feeling well.  Here is how we plan to help!  Based on what you have shared with me it looks like you have sinusitis.  Sinusitis is inflammation and infection in the sinus cavities of the head.  Based on your presentation I believe you most likely have Acute Viral Sinusitis.This is an infection most likely caused by a virus. There is not specific treatment for viral sinusitis other than to help you with the symptoms until the infection runs its course.  You may use an oral decongestant such as Mucinex D or if you have glaucoma or high blood pressure use plain Mucinex. Saline nasal spray help and can safely be used as often as needed for congestion, I have prescribed: Azelastine nasal spray 2 sprays in each nostril twice a day  Some authorities believe that zinc sprays or the use of Echinacea may shorten the course of your symptoms.  Sinus infections are not as easily transmitted as other respiratory infection, however we still recommend that you avoid close contact with loved ones, especially the very young and elderly.  Remember to wash your hands thoroughly throughout the day as this is the number one way to prevent the spread of infection!  Home Care:  Only take medications as instructed by your medical team.  Do not take these medications with alcohol.  A steam or ultrasonic humidifier can help congestion.  You can place a towel over your head and breathe in the steam from hot water coming from a faucet.  Avoid close contacts especially the very young and the elderly.  Cover your mouth when you cough or sneeze.  Always remember to wash your hands.  Get Help Right Away If:  You develop worsening fever or sinus pain.  You develop a severe head ache or visual changes.  Your symptoms persist after you have completed your treatment plan.  Make sure you  Understand these instructions.  Will watch your condition.  Will get help right away if you are not  doing well or get worse.  Your e-visit answers were reviewed by a board certified advanced clinical practitioner to complete your personal care plan.  Depending on the condition, your plan could have included both over the counter or prescription medications.  If there is a problem please reply  once you have received a response from your provider.  Your safety is important to [place].  If you have drug allergies check your prescription carefully.    You can use MyChart to ask questions about today's visit, request a non-urgent call back, or ask for a work or school excuse for 24 hours related to this e-Visit. If it has been greater than 24 hours you will need to follow up with your provider, or enter a new e-Visit to address those concerns.  You will get an e-mail in the next two days asking about your experience.  I hope that your e-visit has been valuable and will speed your recovery. Thank you for using e-visits.   5 minutes spent charting

## 2020-08-11 ENCOUNTER — Ambulatory Visit: Payer: Self-pay

## 2020-08-11 ENCOUNTER — Encounter: Payer: Self-pay | Admitting: Orthopedic Surgery

## 2020-08-11 ENCOUNTER — Ambulatory Visit (INDEPENDENT_AMBULATORY_CARE_PROVIDER_SITE_OTHER): Payer: BC Managed Care – PPO | Admitting: Orthopedic Surgery

## 2020-08-11 DIAGNOSIS — G8929 Other chronic pain: Secondary | ICD-10-CM

## 2020-08-11 DIAGNOSIS — M722 Plantar fascial fibromatosis: Secondary | ICD-10-CM | POA: Diagnosis not present

## 2020-08-11 DIAGNOSIS — M25572 Pain in left ankle and joints of left foot: Secondary | ICD-10-CM

## 2020-08-11 DIAGNOSIS — M9261 Juvenile osteochondrosis of tarsus, right ankle: Secondary | ICD-10-CM

## 2020-08-11 DIAGNOSIS — M6701 Short Achilles tendon (acquired), right ankle: Secondary | ICD-10-CM

## 2020-08-11 DIAGNOSIS — M25571 Pain in right ankle and joints of right foot: Secondary | ICD-10-CM | POA: Diagnosis not present

## 2020-08-11 DIAGNOSIS — M9262 Juvenile osteochondrosis of tarsus, left ankle: Secondary | ICD-10-CM

## 2020-08-11 DIAGNOSIS — M6702 Short Achilles tendon (acquired), left ankle: Secondary | ICD-10-CM

## 2020-08-11 NOTE — Progress Notes (Signed)
Office Visit Note   Patient: Teresa Blankenship           Date of Birth: September 07, 1971           MRN: [id number] Visit Date: 08/11/2020              Requested by: Janith Lima, MD [address] [place],  [place] [address] PCP: Janith Lima, MD  Chief Complaint  Patient presents with  . Left Ankle - Pain  . Right Ankle - Pain      HPI: Patient is a 49 year old woman complaining of bilateral heel pain bilateral plantar fascial pain as well as pain beneath the metatarsal heads bilaterally which is worse on the left than the right.  Assessment & Plan: Visit Diagnoses:  1. Chronic pain of both ankles   2. Haglund's deformity, bilateral   3. Achilles tendon contracture, bilateral   4. Plantar fasciitis, bilateral     Plan: Recommended Achilles stretching this was demonstrated to do a minute at a time 5 times a day recommended Voltaren gel , sole orthotics.  Patient may require Haglund's resection.  Follow-Up Instructions: Return if symptoms worsen or fail to improve.   Ortho Exam  Patient is alert, oriented, no adenopathy, well-dressed, normal affect, normal respiratory effort. Examination patient has a good dorsalis pedis pulse bilaterally she has a palpable Haglund's deformity larger on the left than the right.  She has dorsiflexion to neutral with a little bit more tightness on the left than the right.  She has pain to palpation of the origin of plantar fascia worse on the left than the right and tenderness to palpation beneath the metatarsal heads.  Imaging: XR Ankle 2 Views Left  Result Date: 08/11/2020 2 view radiographs of the left ankle shows a Haglund's deformity as well as calcification of the insertion of the Achilles tendon.  Patient also has a plantar heel spur.  XR Ankle 2 Views Right  Result Date: 08/11/2020 2 view radiographs of the right ankle shows a Haglund's deformity with calcification of the insertion of the Achilles as well as a plantar heel  spur.  No images are attached to the encounter.  Labs: Lab Results  Component Value Date   HGBA1C 4.7 06/27/2018   REPTSTATUS 10/16/2013 FINAL 10/14/2013   CULT  10/14/2013    No Beta Hemolytic Streptococci Isolated Performed at [facility]     Lab Results  Component Value Date   ALBUMIN 4.3 09/18/2019   ALBUMIN 4.5 06/27/2018   ALBUMIN 4.5 08/03/2009    No results found for: MG Lab Results  Component Value Date   VD25OH 26.50 (L) 09/18/2019   VD25OH 18.16 (L) 06/27/2018    No results found for: PREALBUMIN CBC EXTENDED Latest Ref Rng & Units 09/18/2019 06/27/2018 08/03/2009  WBC 4.0 - 10.5 K/uL 8.2 8.7 -  RBC 3.87 - 5.11 Mil/uL 3.72(L) 3.94 -  HGB 12.0 - 15.0 g/dL 12.7 13.2 15.6(H)  HCT 36 - 46 % 37.3 39.2 46.0  PLT 150 - 400 K/uL 360.0 326.0 -  NEUTROABS 1.4 - 7.7 K/uL 4.2 4.8 -  LYMPHSABS 0.7 - 4.0 K/uL 3.0 3.1 -     There is no height or weight on file to calculate BMI.  Orders:  Orders Placed This Encounter  Procedures  . XR Ankle 2 Views Right  . XR Ankle 2 Views Left   No orders of the defined types were placed in this encounter.    Procedures: No  procedures performed  Clinical Data: No additional findings.  ROS:  All other systems negative, except as noted in the HPI. Review of Systems  Objective: Vital Signs: There were no vitals taken for this visit.  Specialty Comments:  No specialty comments available.  PMFS History: Patient Active Problem List   Diagnosis Date Noted  . Primary osteoarthritis of right hip 02/06/2020  . Superficial bruising 09/18/2019  . Vitamin D deficiency disease 06/28/2018  . Menopausal symptom 01/08/2018  . Essential hypertension 09/14/2016  . Routine general medical examination at a health care facility 09/13/2016  . Hyperglycemia 09/13/2016  . Allergic rhinitis 01/20/2012   Past Medical History:  Diagnosis Date  . Allergy     Family History  Problem Relation Age of Onset  . Cancer Mother    . Diabetes Mother   . Heart disease Father   . Hypertension Brother     Past Surgical History:  Procedure Laterality Date  . SHOULDER SURGERY Left   . WISDOM TOOTH EXTRACTION     Social History   Occupational History  . Not on file  Tobacco Use  . Smoking status: Never Smoker  . Smokeless tobacco: Never Used  Substance and Sexual Activity  . Alcohol use: Yes    Alcohol/week: 21.0 standard drinks    Types: 21 Shots of liquor per week    Comment: occasional liquor  . Drug use: Yes    Types: Marijuana  . Sexual activity: Yes    Birth control/protection: Condom, Pill

## 2020-08-13 ENCOUNTER — Telehealth (INDEPENDENT_AMBULATORY_CARE_PROVIDER_SITE_OTHER): Payer: BC Managed Care – PPO | Admitting: Family Medicine

## 2020-08-13 ENCOUNTER — Encounter: Payer: Self-pay | Admitting: Family Medicine

## 2020-08-13 DIAGNOSIS — R0981 Nasal congestion: Secondary | ICD-10-CM

## 2020-08-13 DIAGNOSIS — R519 Headache, unspecified: Secondary | ICD-10-CM

## 2020-08-13 DIAGNOSIS — J302 Other seasonal allergic rhinitis: Secondary | ICD-10-CM

## 2020-08-13 MED ORDER — AMOXICILLIN-POT CLAVULANATE 875-125 MG PO TABS: 1.0000 | ORAL_TABLET | Freq: Two times a day (BID) | ORAL | 0 refills | Status: DC

## 2020-08-13 NOTE — Patient Instructions (Signed)
   It was nice to meet you today, and I really hope you are feeling better soon. I help [facility] out with telemedicine visits on Tuesdays and Thursdays and am available for visits on those days. If you have any concerns or questions following this visit please schedule a follow up visit with your Primary Care doctor or seek care at a local urgent care clinic to avoid delays in care.    Seek in person care promptly if your symptoms worsen, new concerns arise or you are not improving with treatment. Call 911 and/or seek emergency care if you symptoms are severe or life threatening.   -stay home while sick, and if you have [place] please stay home for a full 10 days since the onset of symptoms PLUS one day of no fever and feeling better  -[place] COVID19 testing information: https://www.[facility].org/ OR 581-760-3882 Most pharmacies also offer covid testing.   -start nasal saline twice daily. If symptoms are worsening or are not improving over the next several days - I sent the antibiotic (Augmentin) to the pharmacy.   -I sent the medication(s) we discussed to your pharmacy: Meds ordered this encounter  Medications  . amoxicillin-clavulanate (AUGMENTIN) 875-125 MG tablet    Sig: Take 1 tablet by mouth 2 (two) times daily.    Dispense:  20 tablet    Refill:  0    -can use tylenol or aleve if needed for fevers, aches and pains per instructions  -stay hydrated, drink plenty of fluids and eat small healthy meals - avoid dairy  -can take 1000 IU Vit D3 and Vit C lozenges per instructions  -follow up with your doctor in 2-3 days unless improving and feeling better

## 2020-08-13 NOTE — Progress Notes (Signed)
Virtual Visit via Video Note  I connected with Tvisha  on 08/13/20 at  3:20 PM EDT by a video enabled telemedicine application and verified that I am speaking with the correct person using two identifiers.  Location patient: home, [place] Location provider:work or home office Persons participating in the virtual visit: patient, provider  I discussed the limitations of evaluation and management by telemedicine and the availability of in person appointments. The patient expressed understanding and agreed to proceed.  HPI:  Acute telemedicine visit for Sinus issues: -Onset: 8 days ago, but has chronic congestion with allergies -Symptoms include: sinus congestion, pnd, sore throat, sinus pressure, cough, now with yellow congestin and sinus pain and L ear is hurting -Denies: fever, body aches, NVD, loss of taste or smell -no known sick contacts -Has tried: -Pertinent past medical history: seasonal allergies, xyzal, flonase, azelastine -Pertinent medication allergies:nkda -COVID-19 vaccine status: fully vaccinated for COVID19  ROS: See pertinent positives and negatives per HPI.  Past Medical History:  Diagnosis Date  . Allergy     Past Surgical History:  Procedure Laterality Date  . SHOULDER SURGERY Left   . WISDOM TOOTH EXTRACTION       Current Outpatient Medications:  .  fluticasone (FLONASE) 50 MCG/ACT nasal spray, SHAKE LIQUID AND USE 2 SPRAYS IN EACH NOSTRIL DAILY, Disp: 48 g, Rfl: 1 .  indapamide (LOZOL) 1.25 MG tablet, Take 1 tablet (1.25 mg total) by mouth daily., Disp: 90 tablet, Rfl: 0 .  levocetirizine (XYZAL) 5 MG tablet, Take 1 tablet (5 mg total) by mouth every evening., Disp: 90 tablet, Rfl: 1 .  MONONESSA 0.25-35 MG-MCG tablet, TK 1 T PO  D, Disp: , Rfl: 11 .  nabumetone (RELAFEN) 500 MG tablet, Take 1 tablet (500 mg total) by mouth 2 (two) times daily as needed., Disp: 180 tablet, Rfl: 1 .  VITAMIN D PO, Take by mouth., Disp: , Rfl:  .  amoxicillin-clavulanate  (AUGMENTIN) 875-125 MG tablet, Take 1 tablet by mouth 2 (two) times daily., Disp: 20 tablet, Rfl: 0 .  azelastine (ASTELIN) 0.1 % nasal spray, Place 2 sprays into both nostrils 2 (two) times daily for 7 days. Use in each nostril as directed, Disp: 30 mL, Rfl: 12  EXAM:  VITALS per patient if applicable:  GENERAL: alert, oriented, appears well and in no acute distress  HEENT: atraumatic, conjunttiva clear, no obvious abnormalities on inspection of external nose and ears  NECK: normal movements of the head and neck  LUNGS: on inspection no signs of respiratory distress, breathing rate appears normal, no obvious gross SOB, gasping or wheezing  CV: no obvious cyanosis  MS: moves all visible extremities without noticeable abnormality  PSYCH/NEURO: pleasant and cooperative, no obvious depression or anxiety, speech and thought processing grossly intact  ASSESSMENT AND PLAN:  Discussed the following assessment and plan:  Nasal congestion  Facial pain  Seasonal allergies  -we discussed possible serious and likely etiologies, options for evaluation and workup, limitations of telemedicine visit vs in person visit, treatment, treatment risks and precautions. Pt prefers to treat via telemedicine empirically rather than in person at this moment.  She has chronic issues with seasonal allergies and is taking her Xyzal, Flonase and Astelin nasal spray.  Unfortunately she continued to have some issues the last few weeks which now have worsened with thick discolored sinus congestion and sinus pain.  Discussed the possibility of a bacterial sinusitis versus an acute viral illness versus other along with her underlying allergies.  She opted  to try nasal saline and Augmentin 875 twice daily x7 to 10 days.  Also discussed the possibility of Covid.  She is vaccinated.  Discussed options for testing, precautions, potential complications, treatment and isolation.  Advised to seek prompt in person care if  worsening, new symptoms arise, or if is not improving with treatment. Discussed options for inperson care if PCP office not available. Did let this patient know that I only do telemedicine on Tuesdays and Thursdays for [place]. Advised to schedule follow up visit with PCP or [facility] if any further questions or concerns to avoid delays in care.   I discussed the assessment and treatment plan with the patient. The patient was provided an opportunity to ask questions and all were answered. The patient agreed with the plan and demonstrated an understanding of the instructions.     Lucretia Kern, DO

## 2020-08-21 ENCOUNTER — Ambulatory Visit: Payer: BC Managed Care – PPO | Admitting: Clinical

## 2020-08-24 NOTE — BH Specialist Note (Signed)
Error

## 2020-09-08 ENCOUNTER — Ambulatory Visit: Payer: BC Managed Care – PPO | Admitting: Orthopedic Surgery

## 2020-10-14 ENCOUNTER — Encounter: Payer: Self-pay | Admitting: Physician Assistant

## 2020-10-14 ENCOUNTER — Telehealth (INDEPENDENT_AMBULATORY_CARE_PROVIDER_SITE_OTHER): Payer: BC Managed Care – PPO | Admitting: Physician Assistant

## 2020-10-14 ENCOUNTER — Other Ambulatory Visit: Payer: Self-pay

## 2020-10-14 DIAGNOSIS — J0100 Acute maxillary sinusitis, unspecified: Secondary | ICD-10-CM | POA: Diagnosis not present

## 2020-10-14 MED ORDER — FLUTICASONE PROPIONATE 50 MCG/ACT NA SUSP: NASAL | 1 refills | Status: DC

## 2020-10-14 MED ORDER — AMOXICILLIN-POT CLAVULANATE 875-125 MG PO TABS: 1.0000 | ORAL_TABLET | Freq: Two times a day (BID) | ORAL | 0 refills | Status: DC

## 2020-10-14 NOTE — Progress Notes (Signed)
I have discussed the procedure for the virtual visit with the patient who has given consent to proceed with assessment and treatment.   Teresa Blankenship Teresa Blankenship, CMA     

## 2020-10-14 NOTE — Progress Notes (Signed)
   Virtual Visit via Video   I connected with patient on 10/14/20 at  4:00 PM EST by a video enabled telemedicine application and verified that I am speaking with the correct person using two identifiers.  Location patient: Home Location provider: [place], Office Persons participating in the virtual visit: Patient, Provider, [place] (Patina Moore)  I discussed the limitations of evaluation and management by telemedicine and the availability of in person appointments. The patient expressed understanding and agreed to proceed.  Subjective:   HPI:   Patient presents via [facility] today c/o 1.5 weeks of sinus pressure, nasal congestion, headache and dry cough. Notes now having ear pain and tooth pain bilaterally. Denies chest congestion, chest tightness, or SOB. Denies any fever or chills. Has been taking Sudafed. Denies recent travel or sick contact. Denies known exposure to COVID.  Has had her COVID vaccines.   ROS:   See pertinent positives and negatives per HPI.  Patient Active Problem List   Diagnosis Date Noted  . Primary osteoarthritis of right hip 02/06/2020  . Superficial bruising 09/18/2019  . Vitamin D deficiency disease 06/28/2018  . Menopausal symptom 01/08/2018  . Essential hypertension 09/14/2016  . Routine general medical examination at a health care facility 09/13/2016  . Hyperglycemia 09/13/2016  . Allergic rhinitis 01/20/2012    Social History   Tobacco Use  . Smoking status: Never Smoker  . Smokeless tobacco: Never Used  Substance Use Topics  . Alcohol use: Yes    Alcohol/week: 21.0 standard drinks    Types: 21 Shots of liquor per week    Comment: occasional liquor    Current Outpatient Medications:  Marland Kitchen  MONONESSA 0.25-35 MG-MCG tablet, TK 1 T PO  D, Disp: , Rfl: 11 .  VITAMIN D PO, Take by mouth., Disp: , Rfl:  .  fluticasone (FLONASE) 50 MCG/ACT nasal spray, SHAKE LIQUID AND USE 2 SPRAYS IN EACH NOSTRIL DAILY (Patient not taking: Reported on  10/14/2020), Disp: 48 g, Rfl: 1  No Known Allergies  Objective:   There were no vitals taken for this visit.  Patient is well-developed, well-nourished in no acute distress.  Resting comfortably at home.  Head is normocephalic, atraumatic.  No labored breathing.  Speech is clear and coherent with logical content.  Patient is alert and oriented at baseline.   Assessment and Plan:   1. Acute non-recurrent maxillary sinusitis Rx Augmentin.  Increase fluids.  Rest.  Saline nasal spray.  Probiotic.  Mucinex as directed.  Humidifier in bedroom. Restart Flonase. Vitamin regimen discussed. Discussed COVID testing precautions and again encouraged testing if symptoms not improving.  Call or return to clinic if symptoms are not improving.     Leeanne Rio, PA-C 10/14/2020

## 2020-10-14 NOTE — Patient Instructions (Signed)
Instructions sent to MyChart

## 2020-12-14 ENCOUNTER — Ambulatory Visit (INDEPENDENT_AMBULATORY_CARE_PROVIDER_SITE_OTHER): Payer: BC Managed Care – PPO | Admitting: Orthopedic Surgery

## 2020-12-14 DIAGNOSIS — M9262 Juvenile osteochondrosis of tarsus, left ankle: Secondary | ICD-10-CM | POA: Diagnosis not present

## 2020-12-28 ENCOUNTER — Encounter: Payer: Self-pay | Admitting: Orthopedic Surgery

## 2020-12-28 NOTE — Progress Notes (Signed)
Office Visit Note   Patient: Teresa Blankenship           Date of Birth: March 20, 1971           MRN: [id number] Visit Date: 12/14/2020              Requested by: Janith Lima, MD [address] [place],  [place] [address] PCP: Janith Lima, MD  Chief Complaint  Patient presents with  . Left Foot - Pain      HPI: Patient is a 50 year old woman who presents with a painful Haglund's deformity on the left.  Patient denies any injury has point tenderness at the insertion of the Achilles.  Pain with weightbearing.  Patient denies any specific injury.  Patient has tried Voltaren gel and Achilles stretching without relief.  Assessment & Plan: Visit Diagnoses:  1. Haglund's deformity of left heel     Plan: Discussed that the surgical option would be to proceed with a Haglund's resection possible reattachment of the Achilles tendon.  Risks and benefits were discussed including risk of the wound not healing.  Patient states she understands she will call if she wants to proceed with surgery.  Follow-Up Instructions: Return if symptoms worsen or fail to improve.   Ortho Exam  Patient is alert, oriented, no adenopathy, well-dressed, normal affect, normal respiratory effort. Examination with the knee extended patient has dorsiflexion to neutral she has a good dorsalis pedis and posterior tibial pulse she has a prominent Haglund's deformity and has pain to palpation over the Haglund's deformity.  There are no nodules or swelling in the Achilles no Achilles deficit.  Imaging: No results found. No images are attached to the encounter.  Labs: Lab Results  Component Value Date   HGBA1C 4.7 06/27/2018   REPTSTATUS 10/16/2013 FINAL 10/14/2013   CULT  10/14/2013    No Beta Hemolytic Streptococci Isolated Performed at [facility]     Lab Results  Component Value Date   ALBUMIN 4.3 09/18/2019   ALBUMIN 4.5 06/27/2018   ALBUMIN 4.5 08/03/2009    No results found for: MG Lab  Results  Component Value Date   VD25OH 26.50 (L) 09/18/2019   VD25OH 18.16 (L) 06/27/2018    No results found for: PREALBUMIN CBC EXTENDED Latest Ref Rng & Units 09/18/2019 06/27/2018 08/03/2009  WBC 4.0 - 10.5 K/uL 8.2 8.7 -  RBC 3.87 - 5.11 Mil/uL 3.72(L) 3.94 -  HGB 12.0 - 15.0 g/dL 12.7 13.2 15.6(H)  HCT 36.0 - 46.0 % 37.3 39.2 46.0  PLT 150.0 - 400.0 K/uL 360.0 326.0 -  NEUTROABS 1.4 - 7.7 K/uL 4.2 4.8 -  LYMPHSABS 0.7 - 4.0 K/uL 3.0 3.1 -     There is no height or weight on file to calculate BMI.  Orders:  No orders of the defined types were placed in this encounter.  No orders of the defined types were placed in this encounter.    Procedures: No procedures performed  Clinical Data: No additional findings.  ROS:  All other systems negative, except as noted in the HPI. Review of Systems  Objective: Vital Signs: There were no vitals taken for this visit.  Specialty Comments:  No specialty comments available.  PMFS History: Patient Active Problem List   Diagnosis Date Noted  . Primary osteoarthritis of right hip 02/06/2020  . Superficial bruising 09/18/2019  . Vitamin D deficiency disease 06/28/2018  . Menopausal symptom 01/08/2018  . Essential hypertension 09/14/2016  . Routine general medical  examination at a health care facility 09/13/2016  . Hyperglycemia 09/13/2016  . Allergic rhinitis 01/20/2012   Past Medical History:  Diagnosis Date  . Allergy     Family History  Problem Relation Age of Onset  . Cancer Mother   . Diabetes Mother   . Heart disease Father   . Hypertension Brother     Past Surgical History:  Procedure Laterality Date  . SHOULDER SURGERY Left   . WISDOM TOOTH EXTRACTION     Social History   Occupational History  . Not on file  Tobacco Use  . Smoking status: Never Smoker  . Smokeless tobacco: Never Used  Substance and Sexual Activity  . Alcohol use: Yes    Alcohol/week: 21.0 standard drinks    Types: 21 Shots of  liquor per week    Comment: occasional liquor  . Drug use: Yes    Types: Marijuana  . Sexual activity: Yes    Birth control/protection: Condom, Pill

## 2021-01-04 DIAGNOSIS — Z3041 Encounter for surveillance of contraceptive pills: Secondary | ICD-10-CM | POA: Diagnosis not present

## 2021-01-04 DIAGNOSIS — Z6831 Body mass index (BMI) 31.0-31.9, adult: Secondary | ICD-10-CM | POA: Diagnosis not present

## 2021-01-04 DIAGNOSIS — Z01419 Encounter for gynecological examination (general) (routine) without abnormal findings: Secondary | ICD-10-CM | POA: Diagnosis not present

## 2021-01-04 DIAGNOSIS — Z1231 Encounter for screening mammogram for malignant neoplasm of breast: Secondary | ICD-10-CM | POA: Diagnosis not present

## 2021-01-04 DIAGNOSIS — Z124 Encounter for screening for malignant neoplasm of cervix: Secondary | ICD-10-CM | POA: Diagnosis not present

## 2021-01-04 LAB — HM MAMMOGRAPHY: HM Mammogram: NORMAL (ref 0–4)

## 2021-01-04 LAB — HM PAP SMEAR

## 2021-01-05 ENCOUNTER — Other Ambulatory Visit: Payer: Self-pay | Admitting: Internal Medicine

## 2021-01-05 DIAGNOSIS — I10 Essential (primary) hypertension: Secondary | ICD-10-CM

## 2021-01-27 ENCOUNTER — Other Ambulatory Visit: Payer: Self-pay | Admitting: Internal Medicine

## 2021-01-27 ENCOUNTER — Ambulatory Visit: Payer: BC Managed Care – PPO | Admitting: Internal Medicine

## 2021-01-27 DIAGNOSIS — J301 Allergic rhinitis due to pollen: Secondary | ICD-10-CM

## 2021-02-08 ENCOUNTER — Other Ambulatory Visit: Payer: Self-pay

## 2021-02-08 ENCOUNTER — Ambulatory Visit (INDEPENDENT_AMBULATORY_CARE_PROVIDER_SITE_OTHER): Payer: BC Managed Care – PPO | Admitting: Internal Medicine

## 2021-02-08 ENCOUNTER — Encounter: Payer: Self-pay | Admitting: Internal Medicine

## 2021-02-08 VITALS — BP 136/82 | HR 84 | Temp 98.5°F | Resp 16 | Ht 66.0 in | Wt 191.8 lb

## 2021-02-08 DIAGNOSIS — Z3041 Encounter for surveillance of contraceptive pills: Secondary | ICD-10-CM

## 2021-02-08 DIAGNOSIS — Z0000 Encounter for general adult medical examination without abnormal findings: Secondary | ICD-10-CM

## 2021-02-08 DIAGNOSIS — Z1211 Encounter for screening for malignant neoplasm of colon: Secondary | ICD-10-CM

## 2021-02-08 DIAGNOSIS — I10 Essential (primary) hypertension: Secondary | ICD-10-CM

## 2021-02-08 DIAGNOSIS — J301 Allergic rhinitis due to pollen: Secondary | ICD-10-CM | POA: Diagnosis not present

## 2021-02-08 LAB — HEPATIC FUNCTION PANEL
ALT: 25 U/L (ref 0–35)
AST: 29 U/L (ref 0–37)
Albumin: 4 g/dL (ref 3.5–5.2)
Alkaline Phosphatase: 32 U/L — ABNORMAL LOW (ref 39–117)
Bilirubin, Direct: 0.1 mg/dL (ref 0.0–0.3)
Total Bilirubin: 0.3 mg/dL (ref 0.2–1.2)
Total Protein: 6.7 g/dL (ref 6.0–8.3)

## 2021-02-08 LAB — LDL CHOLESTEROL, DIRECT: Direct LDL: 59 mg/dL

## 2021-02-08 LAB — CBC WITH DIFFERENTIAL/PLATELET
Basophils Absolute: 0 10*3/uL (ref 0.0–0.1)
Basophils Relative: 0.5 % (ref 0.0–3.0)
Eosinophils Absolute: 0.2 10*3/uL (ref 0.0–0.7)
Eosinophils Relative: 3.4 % (ref 0.0–5.0)
HCT: 34.8 % — ABNORMAL LOW (ref 36.0–46.0)
Hemoglobin: 12.1 g/dL (ref 12.0–15.0)
Lymphocytes Relative: 41.1 % (ref 12.0–46.0)
Lymphs Abs: 3 10*3/uL (ref 0.7–4.0)
MCHC: 34.7 g/dL (ref 30.0–36.0)
MCV: 99.9 fl (ref 78.0–100.0)
Monocytes Absolute: 0.5 10*3/uL (ref 0.1–1.0)
Monocytes Relative: 6.7 % (ref 3.0–12.0)
Neutro Abs: 3.5 10*3/uL (ref 1.4–7.7)
Neutrophils Relative %: 48.3 % (ref 43.0–77.0)
Platelets: 309 10*3/uL (ref 150.0–400.0)
RBC: 3.49 Mil/uL — ABNORMAL LOW (ref 3.87–5.11)
RDW: 11.8 % (ref 11.5–15.5)
WBC: 7.3 10*3/uL (ref 4.0–10.5)

## 2021-02-08 LAB — BASIC METABOLIC PANEL
BUN: 14 mg/dL (ref 6–23)
CO2: 29 mEq/L (ref 19–32)
Calcium: 9 mg/dL (ref 8.4–10.5)
Chloride: 104 mEq/L (ref 96–112)
Creatinine, Ser: 0.84 mg/dL (ref 0.40–1.20)
GFR: 81.7 mL/min (ref >60.00)
Glucose, Bld: 98 mg/dL (ref 70–99)
Potassium: 3.7 mEq/L (ref 3.5–5.1)
Sodium: 139 mEq/L (ref 135–145)

## 2021-02-08 LAB — URINALYSIS, ROUTINE W REFLEX MICROSCOPIC
Bilirubin Urine: NEGATIVE
Nitrite: POSITIVE — AB
Specific Gravity, Urine: 1.02 (ref 1.000–1.030)
Total Protein, Urine: 100 — AB
Urine Glucose: NEGATIVE
Urobilinogen, UA: 1 (ref 0.0–1.0)
pH: 7 (ref 5.0–8.0)

## 2021-02-08 LAB — LIPID PANEL
Cholesterol: 137 mg/dL (ref 0–200)
HDL: 55.6 mg/dL (ref >39.00)
NonHDL: 81.82
Total CHOL/HDL Ratio: 2
Triglycerides: 207 mg/dL — ABNORMAL HIGH (ref 0.0–149.0)
VLDL: 41.4 mg/dL — ABNORMAL HIGH (ref 0.0–40.0)

## 2021-02-08 LAB — TSH: TSH: 0.37 u[IU]/mL (ref 0.35–4.50)

## 2021-02-08 MED ORDER — INDAPAMIDE 1.25 MG PO TABS: 1.2500 mg | ORAL_TABLET | Freq: Every day | ORAL | 1 refills | Status: DC

## 2021-02-08 MED ORDER — FLUTICASONE PROPIONATE 50 MCG/ACT NA SUSP: NASAL | 1 refills | Status: DC

## 2021-02-08 NOTE — Patient Instructions (Signed)

## 2021-02-08 NOTE — Progress Notes (Signed)
Subjective:  Patient ID: Teresa Blankenship, female    DOB: 25-Aug-1971  Age: 50 y.o. MRN: [id number]  CC: Annual Exam and Hypertension  This visit occurred during the SARS-CoV-2 public health emergency.  Safety protocols were in place, including screening questions prior to the visit, additional usage of staff PPE, and extensive cleaning of exam room while observing appropriate contact time as indicated for disinfecting solutions.    HPI Teresa Blankenship presents for a CPX and f/up -   She tells me her blood pressure has been well controlled.  She denies any recent episodes of chest pain, shortness of breath, palpitations, edema, or fatigue.  Outpatient Medications Prior to Visit  Medication Sig Dispense Refill  . drospirenone-ethinyl estradiol (YAZ) 3-0.02 MG tablet Take 1 tablet by mouth daily.    Marland Kitchen levocetirizine (XYZAL) 5 MG tablet TAKE 1 TABLET(5 MG) BY MOUTH EVERY EVENING 90 tablet 1  . VITAMIN D PO Take by mouth.    . fluticasone (FLONASE) 50 MCG/ACT nasal spray SHAKE LIQUID AND USE 2 SPRAYS IN EACH NOSTRIL DAILY 48 g 1  . MONONESSA 0.25-35 MG-MCG tablet TK 1 T PO  D  11  . amoxicillin-clavulanate (AUGMENTIN) 875-125 MG tablet Take 1 tablet by mouth 2 (two) times daily. (Patient not taking: Reported on 02/08/2021) 14 tablet 0   No facility-administered medications prior to visit.    ROS Review of Systems  Constitutional: Negative.  Negative for diaphoresis, fatigue and unexpected weight change.  HENT: Positive for congestion, postnasal drip and rhinorrhea. Negative for sinus pressure and sore throat.   Eyes: Negative.   Respiratory: Negative for cough, chest tightness, shortness of breath and wheezing.   Cardiovascular: Negative for chest pain, palpitations and leg swelling.  Gastrointestinal: Negative for abdominal pain, constipation, diarrhea, nausea and vomiting.  Endocrine: Negative.   Genitourinary: Negative.  Negative for difficulty urinating.  Musculoskeletal: Negative.   Negative for arthralgias, back pain and myalgias.  Skin: Negative for color change and pallor.  Neurological: Negative.  Negative for dizziness and weakness.  Hematological: Negative for adenopathy. Does not bruise/bleed easily.  Psychiatric/Behavioral: Negative.     Objective:  BP 136/82 (BP Location: Right Arm, Patient Position: Sitting, Cuff Size: Large)   Pulse 84   Temp 98.5 F (36.9 C) (Oral)   Resp 16   Ht 5' 6"  (1.676 m)   Wt 191 lb 12.8 oz (87 kg)   LMP 02/07/2021   SpO2 98%   BMI 30.96 kg/m   BP Readings from Last 3 Encounters:  02/08/21 136/82  02/06/20 140/80  09/18/19 (!) 160/84    Wt Readings from Last 3 Encounters:  02/08/21 191 lb 12.8 oz (87 kg)  02/06/20 197 lb (89.4 kg)  09/18/19 194 lb (88 kg)    Physical Exam Vitals reviewed.  HENT:     Nose: Nose normal.     Mouth/Throat:     Mouth: Mucous membranes are moist.  Eyes:     General: No scleral icterus.    Conjunctiva/sclera: Conjunctivae normal.  Cardiovascular:     Rate and Rhythm: Normal rate and regular rhythm.     Heart sounds: No murmur heard.   Pulmonary:     Effort: Pulmonary effort is normal.     Breath sounds: No stridor. No wheezing, rhonchi or rales.  Abdominal:     General: Abdomen is flat.     Palpations: There is no mass.     Tenderness: There is no abdominal tenderness. There is no guarding.  Musculoskeletal:        General: Normal range of motion.     Cervical back: Neck supple.     Right lower leg: No edema.     Left lower leg: No edema.  Lymphadenopathy:     Cervical: No cervical adenopathy.  Skin:    General: Skin is warm and dry.     Coloration: Skin is not pale.  Neurological:     General: No focal deficit present.     Mental Status: She is alert.  Psychiatric:        Mood and Affect: Mood normal.        Behavior: Behavior normal.     Lab Results  Component Value Date   WBC 7.3 02/08/2021   HGB 12.1 02/08/2021   HCT 34.8 (L) 02/08/2021   PLT 309.0  02/08/2021   GLUCOSE 98 02/08/2021   CHOL 137 02/08/2021   TRIG 207.0 (H) 02/08/2021   HDL 55.60 02/08/2021   LDLDIRECT 59.0 02/08/2021   ALT 25 02/08/2021   AST 29 02/08/2021   NA 139 02/08/2021   K 3.7 02/08/2021   CL 104 02/08/2021   CREATININE 0.84 02/08/2021   BUN 14 02/08/2021   CO2 29 02/08/2021   TSH 0.37 02/08/2021   INR 1.0 09/18/2019   HGBA1C 4.7 06/27/2018    MR Cervical Spine w/o contrast  Result Date: 05/08/2017 CLINICAL DATA:  50 y/o  F; neck pain radicular arm pain. EXAM: MRI CERVICAL SPINE WITHOUT CONTRAST TECHNIQUE: Multiplanar, multisequence MR imaging of the cervical spine was performed. No intravenous contrast was administered. COMPARISON:  04/26/2017 cervical spine radiographs. FINDINGS: Alignment: Straightening of cervical lordosis without listhesis. Vertebrae: No fracture, evidence of discitis, or bone lesion. Cord: Normal signal and morphology. Posterior Fossa, vertebral arteries, paraspinal tissues: Negative. Disc levels: C2-3: No significant disc displacement, foraminal narrowing, or canal stenosis. C3-4: Small disc bulge eccentric to the left with mild left foraminal stenosis. No significant canal stenosis. C4-5: Small disc osteophyte complex with prominent right-sided uncovertebral hypertrophy. Mild left and moderate to severe right foraminal stenosis. No significant canal stenosis. C5-6: Moderate disc osteophyte complex with bilateral uncovertebral and facet hypertrophy. Moderate bilateral foraminal stenosis and mild canal stenosis with anterior cord impingement and flattening. C6-7: Disc osteophyte complex with left-greater-than-right uncovertebral and facet hypertrophy. Moderate to severe left and mild right foraminal stenosis. Right central 6 mm disc protrusion impinges the right anterior cord with cord flattening and moderate to severe canal stenosis. C7-T1: No significant disc displacement, foraminal narrowing, or canal stenosis. IMPRESSION: 1. C6-7 right  central disc protrusion with right anterior cord impingement, cord flattening, and moderate to severe canal stenosis. 2. Multiple levels of mild foraminal stenosis with moderate to severe right C4-5, moderate bilateral C5-6, and moderate to severe left C6-7 foraminal stenosis. 3. No acute osseous abnormality.  No abnormal cord signal. Electronically Signed   By: Kristine Garbe M.D.   On: 05/08/2017 01:50    Assessment & Plan:   Teresa Blankenship was seen today for annual exam and hypertension.  Diagnoses and all orders for this visit:  Essential hypertension- Her blood pressure is adequately well controlled.  Labs are negative for secondary causes or endorgan damage.  Will continue the current dose of indapamide. -     indapamide (LOZOL) 1.25 MG tablet; Take 1 tablet (1.25 mg total) by mouth daily. -     CBC with Differential/Platelet; Future -     Basic metabolic panel; Future -     TSH; Future -  Hepatic function panel; Future -     Urinalysis, Routine w reflex microscopic; Future -     Urinalysis, Routine w reflex microscopic -     Hepatic function panel -     TSH -     Basic metabolic panel -     CBC with Differential/Platelet  Routine general medical examination at a health care facility- Exam completed, labs reviewed - Statin therapy is not indicated, vaccines reviewed , cancer screenings addressed, patient education was given. -     Lipid panel; Future -     HIV Antibody (routine testing w rflx); Future -     Hepatitis C antibody; Future -     Hepatitis C antibody -     HIV Antibody (routine testing w rflx) -     Lipid panel  Seasonal allergic rhinitis due to pollen -     fluticasone (FLONASE) 50 MCG/ACT nasal spray; SHAKE LIQUID AND USE 2 SPRAYS IN EACH NOSTRIL DAILY  Colon cancer screening -     Cologuard  Other orders -     LDL cholesterol, direct   I have discontinued Soleil D. Thurow's MonoNessa and amoxicillin-clavulanate. I am also having her maintain her  VITAMIN D PO, levocetirizine, drospirenone-ethinyl estradiol, fluticasone, and indapamide.  Meds ordered this encounter  Medications  . fluticasone (FLONASE) 50 MCG/ACT nasal spray    Sig: SHAKE LIQUID AND USE 2 SPRAYS IN EACH NOSTRIL DAILY    Dispense:  48 g    Refill:  1  . indapamide (LOZOL) 1.25 MG tablet    Sig: Take 1 tablet (1.25 mg total) by mouth daily.    Dispense:  90 tablet    Refill:  1     Follow-up: Return in about 6 months (around 08/11/2021).  Scarlette Calico, MD

## 2021-02-09 LAB — HEPATITIS C ANTIBODY
Hepatitis C Ab: NONREACTIVE
SIGNAL TO CUT-OFF: 0.06 (ref <1.00)

## 2021-02-09 LAB — HIV ANTIBODY (ROUTINE TESTING W REFLEX): HIV 1&2 Ab, 4th Generation: NONREACTIVE

## 2021-02-16 DIAGNOSIS — Z1211 Encounter for screening for malignant neoplasm of colon: Secondary | ICD-10-CM | POA: Diagnosis not present

## 2021-02-24 LAB — COLOGUARD: Cologuard: POSITIVE — AB

## 2021-02-26 ENCOUNTER — Other Ambulatory Visit: Payer: Self-pay | Admitting: Internal Medicine

## 2021-02-26 DIAGNOSIS — R195 Other fecal abnormalities: Secondary | ICD-10-CM | POA: Insufficient documentation

## 2021-03-03 ENCOUNTER — Encounter: Payer: Self-pay | Admitting: Gastroenterology

## 2021-04-30 ENCOUNTER — Ambulatory Visit: Payer: BC Managed Care – PPO

## 2021-04-30 VITALS — Ht 66.0 in | Wt 189.0 lb

## 2021-04-30 DIAGNOSIS — R195 Other fecal abnormalities: Secondary | ICD-10-CM

## 2021-04-30 MED ORDER — PLENVU 140 G PO SOLR: 1.0000 | Freq: Once | ORAL | 0 refills | Status: AC

## 2021-04-30 NOTE — Progress Notes (Signed)
No egg or soy allergy known to patient  No issues with past sedation with any surgeries or procedures Patient denies ever being told they had issues or difficulty with intubation  No FH of Malignant Hyperthermia No diet pills per patient No home 02 use per patient  No blood thinners per patient  Pt denies issues with constipation  No A fib or A flutter  EMMI video to pt or via [place] 19 guidelines implemented in [place] today with Pt and RN  Pt is fully vaccinated  for Covid   Plenvu Coupon given to pt in [facility] today , Code to Pharmacy and  NO PA's for preps discussed with pt In [facility] today  Discussed with pt there will be an out-of-pocket cost for prep and that varies from $0 to 70 dollars   Due to the COVID-19 pandemic we are asking patients to follow certain guidelines.  Pt aware of COVID protocols and [facility] guidelines

## 2021-05-04 ENCOUNTER — Telehealth: Payer: BC Managed Care – PPO | Admitting: Physician Assistant

## 2021-05-04 DIAGNOSIS — B373 Candidiasis of vulva and vagina: Secondary | ICD-10-CM

## 2021-05-04 DIAGNOSIS — B3731 Acute candidiasis of vulva and vagina: Secondary | ICD-10-CM

## 2021-05-04 MED ORDER — FLUCONAZOLE 150 MG PO TABS: 150.0000 mg | ORAL_TABLET | Freq: Once | ORAL | 0 refills | Status: AC

## 2021-05-04 NOTE — Progress Notes (Signed)
I have spent 5 minutes in review of e-visit questionnaire, review and updating patient chart, medical decision making and response to patient.   Jiovany Scheffel Cody Arlin Sass, PA-C    

## 2021-05-04 NOTE — Progress Notes (Signed)
E-Visit for Vaginal Symptoms  We are sorry that you are not feeling well. Here is how we plan to help! Based on what you shared with me it looks like you: May have a yeast vaginosis  Vaginosis is an inflammation of the vagina that can result in discharge, itching and pain. The cause is usually a change in the normal balance of vaginal bacteria or an infection. Vaginosis can also result from reduced estrogen levels after menopause.  The most common causes of vaginosis are:   Bacterial vaginosis which results from an overgrowth of one on several organisms that are normally present in your vagina.   Yeast infections which are caused by a naturally occurring fungus called candida.   Vaginal atrophy (atrophic vaginosis) which results from the thinning of the vagina from reduced estrogen levels after menopause.   Trichomoniasis which is caused by a parasite and is commonly transmitted by sexual intercourse.  Factors that increase your risk of developing vaginosis include: Medications, such as antibiotics and steroids Uncontrolled diabetes Use of hygiene products such as bubble bath, vaginal spray or vaginal deodorant Douching Wearing damp or tight-fitting clothing Using an intrauterine device (IUD) for birth control Hormonal changes, such as those associated with pregnancy, birth control pills or menopause Sexual activity Having a sexually transmitted infection  Your treatment plan is A single Diflucan (fluconazole) 149m tablet once.  I have electronically sent this prescription into the pharmacy that you have chosen. If symptoms are not resolving, anything new develops or anything worsens, you will need to be seen in person with your PCP, GYN or at a local Urgent Care -- this is very important.   Be sure to take all of the medication as directed. Stop taking any medication if you develop a rash, tongue swelling or shortness of breath. Mothers who are breast feeding should consider pumping  and discarding their breast milk while on these antibiotics. However, there is no consensus that infant exposure at these doses would be harmful.  Remember that medication creams can weaken latex condoms. .Marland Kitchen  HOME CARE:  Good hygiene may prevent some types of vaginosis from recurring and may relieve some symptoms:  Avoid baths, hot tubs and whirlpool spas. Rinse soap from your outer genital area after a shower, and dry the area well to prevent irritation. Don't use scented or harsh soaps, such as those with deodorant or antibacterial action. Avoid irritants. These include scented tampons and pads. Wipe from front to back after using the toilet. Doing so avoids spreading fecal bacteria to your vagina.  Other things that may help prevent vaginosis include:  Don't douche. Your vagina doesn't require cleansing other than normal bathing. Repetitive douching disrupts the normal organisms that reside in the vagina and can actually increase your risk of vaginal infection. Douching won't clear up a vaginal infection. Use a latex condom. Both female and female latex condoms may help you avoid infections spread by sexual contact. Wear cotton underwear. Also wear pantyhose with a cotton crotch. If you feel comfortable without it, skip wearing underwear to bed. Yeast thrives in mCampbell SoupYour symptoms should improve in the next day or two.  GET HELP RIGHT AWAY IF:  You have pain in your lower abdomen ( pelvic area or over your ovaries) You develop nausea or vomiting You develop a fever Your discharge changes or worsens You have persistent pain with intercourse You develop shortness of breath, a rapid pulse, or you faint.  These symptoms could be signs of  problems or infections that need to be evaluated by a medical provider now.  MAKE SURE YOU   Understand these instructions. Will watch your condition. Will get help right away if you are not doing well or get worse.  Thank you for  choosing an e-visit.  Your e-visit answers were reviewed by a board certified advanced clinical practitioner to complete your personal care plan. Depending upon the condition, your plan could have included both over the counter or prescription medications.  Please review your pharmacy choice. Make sure the pharmacy is open so you can pick up prescription now. If there is a problem, you may contact your provider through [employer] and have the prescription routed to another pharmacy.  Your safety is important to [place]. If you have drug allergies check your prescription carefully.   For the next 24 hours you can use MyChart to ask questions about today's visit, request a non-urgent call back, or ask for a work or school excuse. You will get an email in the next two days asking about your experience. I hope that your e-visit has been valuable and will speed your recovery.

## 2021-05-04 NOTE — Progress Notes (Signed)
Message sent to patient requesting further input regarding current symptoms. Awaiting patient response.

## 2021-05-13 ENCOUNTER — Other Ambulatory Visit: Payer: Self-pay | Admitting: Internal Medicine

## 2021-05-13 ENCOUNTER — Encounter: Payer: Self-pay | Admitting: Internal Medicine

## 2021-05-13 ENCOUNTER — Encounter: Payer: Self-pay | Admitting: Certified Registered Nurse Anesthetist

## 2021-05-13 DIAGNOSIS — J301 Allergic rhinitis due to pollen: Secondary | ICD-10-CM

## 2021-05-13 MED ORDER — FLUTICASONE PROPIONATE 50 MCG/ACT NA SUSP: NASAL | 1 refills | Status: DC

## 2021-05-14 ENCOUNTER — Ambulatory Visit (AMBULATORY_SURGERY_CENTER): Payer: BC Managed Care – PPO | Admitting: Gastroenterology

## 2021-05-14 ENCOUNTER — Encounter: Payer: Self-pay | Admitting: Gastroenterology

## 2021-05-14 ENCOUNTER — Other Ambulatory Visit: Payer: Self-pay

## 2021-05-14 VITALS — BP 157/91 | HR 83 | Temp 98.6°F | Resp 18 | Ht 66.0 in | Wt 189.0 lb

## 2021-05-14 DIAGNOSIS — K5730 Diverticulosis of large intestine without perforation or abscess without bleeding: Secondary | ICD-10-CM | POA: Diagnosis not present

## 2021-05-14 DIAGNOSIS — D125 Benign neoplasm of sigmoid colon: Secondary | ICD-10-CM

## 2021-05-14 DIAGNOSIS — K635 Polyp of colon: Secondary | ICD-10-CM

## 2021-05-14 DIAGNOSIS — K640 First degree hemorrhoids: Secondary | ICD-10-CM

## 2021-05-14 DIAGNOSIS — R195 Other fecal abnormalities: Secondary | ICD-10-CM

## 2021-05-14 MED ORDER — SODIUM CHLORIDE 0.9 % IV SOLN: 500.0000 mL | Freq: Once | INTRAVENOUS | Status: DC

## 2021-05-14 NOTE — Op Note (Signed)
[facility] Patient Name: Teresa Blankenship Procedure Date: 05/14/2021 9:09 AM MRN: [id number] Endoscopist: Gerrit Heck , MD Age: 50 Referring MD:  Date of Birth: 17-Jan-1971 Gender: Female Account #: [id number] Procedure:                Colonoscopy Indications:              This is the patient's first colonoscopy, Positive                            Cologuard test. Otherwise no active GI symtoms.                            Family history notable for mother with colon                            polyps, but no known family history of colon cancer. Medicines:                Monitored Anesthesia Care Procedure:                Pre-Anesthesia Assessment:                           - Prior to the procedure, a History and Physical                            was performed, and patient medications and                            allergies were reviewed. The patient's tolerance of                            previous anesthesia was also reviewed. The risks                            and benefits of the procedure and the sedation                            options and risks were discussed with the patient.                            All questions were answered, and informed consent                            was obtained. Prior Anticoagulants: The patient has                            taken no previous anticoagulant or antiplatelet                            agents. ASA Grade Assessment: II - A patient with                            mild systemic disease. After reviewing the risks  and benefits, the patient was deemed in                            satisfactory condition to undergo the procedure.                           After obtaining informed consent, the colonoscope                            was passed under direct vision. Throughout the                            procedure, the patient's blood pressure, pulse, and                            oxygen  saturations were monitored continuously. The                            Olympus CF-HQ190L Colonscope was introduced through                            the anus and advanced to the the terminal ileum.                            The colonoscopy was performed without difficulty.                            The patient tolerated the procedure well. The                            quality of the bowel preparation was good. The                            terminal ileum, ileocecal valve, appendiceal                            orifice, and rectum were photographed. Scope In: 9:13:53 AM Scope Out: 9:38:54 AM Scope Withdrawal Time: 0 hours 21 minutes 19 seconds  Total Procedure Duration: 0 hours 25 minutes 1 second  Findings:                 The perianal and digital rectal examinations were                            normal.                           A 6 mm polyp was found in the sigmoid colon. The                            polyp was sessile. The polyp was removed with a                            cold snare. Resection and retrieval were complete.  Estimated blood loss was minimal.                           Three sessile polyps were found in the distal                            sigmoid colon. The polyps were 1 to 2 mm in size.                            These polyps were removed with a cold biopsy                            forceps. Resection and retrieval were complete.                            Estimated blood loss was minimal.                           Multiple small and large-mouthed diverticula were                            found in the entire colon.                           Non-bleeding internal hemorrhoids were found during                            retroflexion. The hemorrhoids were small.                           The terminal ileum appeared normal. Complications:            No immediate complications. Estimated Blood Loss:     Estimated blood loss was  minimal. Impression:               - One 6 mm polyp in the sigmoid colon, removed with                            a cold snare. Resected and retrieved.                           - Three 1 to 2 mm polyps in the distal sigmoid                            colon, removed with a cold biopsy forceps. Resected                            and retrieved.                           - Diverticulosis in the entire examined colon.                           - Non-bleeding internal hemorrhoids.                           -  The examined portion of the ileum was normal. Recommendation:           - Patient has a contact number available for                            emergencies. The signs and symptoms of potential                            delayed complications were discussed with the                            patient. Return to normal activities tomorrow.                            Written discharge instructions were provided to the                            patient.                           - Resume previous diet.                           - Continue present medications.                           - Await pathology results.                           - Repeat colonoscopy for surveillance based on                            pathology results.                           - Return to GI office PRN. Gerrit Heck, MD 05/14/2021 9:47:11 AM

## 2021-05-14 NOTE — Patient Instructions (Signed)
YOU HAD AN ENDOSCOPIC PROCEDURE TODAY AT THE [facility]:   Refer to the procedure report that was given to you for any specific questions about what was found during the examination.  If the procedure report does not answer your questions, please call your gastroenterologist to clarify.  If you requested that your care partner not be given the details of your procedure findings, then the procedure report has been included in a sealed envelope for you to review at your convenience later.  YOU SHOULD EXPECT: Some feelings of bloating in the abdomen. Passage of more gas than usual.  Walking can help get rid of the air that was put into your GI tract during the procedure and reduce the bloating. If you had a lower endoscopy (such as a colonoscopy or flexible sigmoidoscopy) you may notice spotting of blood in your stool or on the toilet paper. If you underwent a bowel prep for your procedure, you may not have a normal bowel movement for a few days.  Please Note:  You might notice some irritation and congestion in your nose or some drainage.  This is from the oxygen used during your procedure.  There is no need for concern and it should clear up in a day or so.  SYMPTOMS TO REPORT IMMEDIATELY:   Following lower endoscopy (colonoscopy or flexible sigmoidoscopy):  Excessive amounts of blood in the stool  Significant tenderness or worsening of abdominal pains  Swelling of the abdomen that is new, acute  Fever of 100F or higher   Following upper endoscopy (EGD)  Vomiting of blood or coffee ground material  New chest pain or pain under the shoulder blades  Painful or persistently difficult swallowing  New shortness of breath  Fever of 100F or higher  Black, tarry-looking stools  For urgent or emergent issues, a gastroenterologist can be reached at any hour by calling [phone]. Do not use MyChart messaging for urgent concerns.    DIET:  We do recommend a small meal at first, but  then you may proceed to your regular diet.  Drink plenty of fluids but you should avoid alcoholic beverages for 24 hours.  ACTIVITY:  You should plan to take it easy for the rest of today and you should NOT DRIVE or use heavy machinery until tomorrow (because of the sedation medicines used during the test).    FOLLOW UP: Our staff will call the number listed on your records 48-72 hours following your procedure to check on you and address any questions or concerns that you may have regarding the information given to you following your procedure. If we do not reach you, we will leave a message.  We will attempt to reach you two times.  During this call, we will ask if you have developed any symptoms of COVID 19. If you develop any symptoms (ie: fever, flu-like symptoms, shortness of breath, cough etc.) before then, please call [phone].  If you test positive for Covid 19 in the 2 weeks post procedure, please call and report this information to us.    If any biopsies were taken you will be contacted by phone or by letter within the next 1-3 weeks.  Please call us at [phone] if you have not heard about the biopsies in 3 weeks.    SIGNATURES/CONFIDENTIALITY: You and/or your care partner have signed paperwork which will be entered into your electronic medical record.  These signatures attest to the fact that that the information above on   your After Visit Summary has been reviewed and is understood.  Full responsibility of the confidentiality of this discharge information lies with you and/or your care-partner. 

## 2021-05-14 NOTE — Progress Notes (Signed)
Report given to PACU, vss 

## 2021-05-14 NOTE — Progress Notes (Signed)
Called to room to assist during endoscopic procedure.  Patient ID and intended procedure confirmed with present staff. Received instructions for my participation in the procedure from the performing physician.  

## 2021-05-14 NOTE — Progress Notes (Signed)
Pt's states no medical or surgical changes since previsit or office visit.   VS taken by CW

## 2021-05-18 ENCOUNTER — Telehealth: Payer: Self-pay | Admitting: *Deleted

## 2021-05-18 NOTE — Telephone Encounter (Signed)
  Follow up Call-  Call back number 05/14/2021  Post procedure Call Back phone  # [phone]  Permission to leave phone message Yes  Some recent data might be hidden     Patient questions:  Do you have a fever, pain , or abdominal swelling? No. Pain Score  0 *  Have you tolerated food without any problems? Yes.    Have you been able to return to your normal activities? Yes.    Do you have any questions about your discharge instructions: Diet   No. Medications  No. Follow up visit  No.  Do you have questions or concerns about your Care? No.  Actions: * If pain score is 4 or above: No action needed, pain <4.Have you developed a fever since your procedure? no  2.   Have you had an respiratory symptoms (SOB or cough) since your procedure? no  3.   Have you tested positive for COVID 19 since your procedure no  4.   Have you had any family members/close contacts diagnosed with the COVID 19 since your procedure?  no   If yes to any of these questions please route to Joylene John, RN and Joella Prince, RN

## 2021-05-24 ENCOUNTER — Telehealth: Payer: BC Managed Care – PPO | Admitting: Nurse Practitioner

## 2021-05-24 DIAGNOSIS — J069 Acute upper respiratory infection, unspecified: Secondary | ICD-10-CM | POA: Diagnosis not present

## 2021-05-24 MED ORDER — FLUTICASONE PROPIONATE 50 MCG/ACT NA SUSP
2.0000 | Freq: Every day | NASAL | 6 refills | Status: DC
Start: 2021-05-24 — End: 2021-12-16

## 2021-05-24 NOTE — Progress Notes (Signed)
E-Visit for Sinus Problems  We are sorry that you are not feeling well.  Here is how we plan to help!  Based on what you have shared with me it looks like you have sinusitis.  Sinusitis is inflammation and infection in the sinus cavities of the head.  Based on your presentation I believe you most likely have Acute Viral Sinusitis.This is an infection most likely caused by a virus. There is not specific treatment for viral sinusitis other than to help you with the symptoms until the infection runs its course.  You may use an oral decongestant such as Mucinex D or if you have glaucoma or high blood pressure use plain Mucinex. Saline nasal spray help and can safely be used as often as needed for congestion, I have prescribed: Fluticasone nasal spray two sprays in each nostril once a day  We try our best not to over prescribe antibiotics, for the first 7-10 days of nasal congestion over the counter medications like decongestants are all that are indicated. If symptoms persist beyond a week it is possible that you may develop a bacterial infection and we encourage you at that time if needed to schedule a follow up with [place] or your primary care doctor.    Some authorities believe that zinc sprays or the use of Echinacea may shorten the course of your symptoms.  Sinus infections are not as easily transmitted as other respiratory infection, however we still recommend that you avoid close contact with loved ones, especially the very young and elderly.  Remember to wash your hands thoroughly throughout the day as this is the number one way to prevent the spread of infection!  Home Care: Only take medications as instructed by your medical team. Do not take these medications with alcohol. A steam or ultrasonic humidifier can help congestion.  You can place a towel over your head and breathe in the steam from hot water coming from a faucet. Avoid close contacts especially the very young and the elderly. Cover  your mouth when you cough or sneeze. Always remember to wash your hands.  Get Help Right Away If: You develop worsening fever or sinus pain. You develop a severe head ache or visual changes. Your symptoms persist after you have completed your treatment plan.  Make sure you Understand these instructions. Will watch your condition. Will get help right away if you are not doing well or get worse.   Thank you for choosing an e-visit.  Your e-visit answers were reviewed by a board certified advanced clinical practitioner to complete your personal care plan. Depending upon the condition, your plan could have included both over the counter or prescription medications.  Please review your pharmacy choice. Make sure the pharmacy is open so you can pick up prescription now. If there is a problem, you may contact your provider through [employer] and have the prescription routed to another pharmacy.  Your safety is important to [place]. If you have drug allergies check your prescription carefully.   For the next 24 hours you can use MyChart to ask questions about today's visit, request a non-urgent call back, or ask for a work or school excuse. You will get an email in the next two days asking about your experience. I hope that your e-visit has been valuable and will speed your recovery.  I spent approximately 7 minutes reviewing the patient's history, current symptoms and coordinating their plan of care today.

## 2021-05-25 ENCOUNTER — Encounter: Payer: Self-pay | Admitting: Gastroenterology

## 2021-06-10 ENCOUNTER — Ambulatory Visit: Payer: BC Managed Care – PPO | Admitting: Orthopedic Surgery

## 2021-07-02 ENCOUNTER — Other Ambulatory Visit: Payer: Self-pay | Admitting: Internal Medicine

## 2021-07-02 DIAGNOSIS — J301 Allergic rhinitis due to pollen: Secondary | ICD-10-CM

## 2021-07-15 ENCOUNTER — Other Ambulatory Visit: Payer: Self-pay | Admitting: Internal Medicine

## 2021-07-15 ENCOUNTER — Telehealth: Payer: Self-pay

## 2021-07-15 DIAGNOSIS — J301 Allergic rhinitis due to pollen: Secondary | ICD-10-CM

## 2021-07-15 MED ORDER — LEVOCETIRIZINE DIHYDROCHLORIDE 5 MG PO TABS: 5.0000 mg | ORAL_TABLET | Freq: Every evening | ORAL | 1 refills | Status: DC

## 2021-08-17 ENCOUNTER — Telehealth: Payer: BC Managed Care – PPO | Admitting: Family Medicine

## 2021-08-17 DIAGNOSIS — J0140 Acute pansinusitis, unspecified: Secondary | ICD-10-CM | POA: Diagnosis not present

## 2021-08-17 MED ORDER — AMOXICILLIN-POT CLAVULANATE 875-125 MG PO TABS: 1.0000 | ORAL_TABLET | Freq: Two times a day (BID) | ORAL | 0 refills | Status: DC

## 2021-08-17 NOTE — Progress Notes (Signed)
E-Visit for Sinus Problems  We are sorry that you are not feeling well.  Here is how we plan to help!  Based on what you have shared with me it looks like you have sinusitis.  Sinusitis is inflammation and infection in the sinus cavities of the head.  Based on your presentation I believe you most likely have Acute Bacterial Sinusitis.  This is an infection caused by bacteria and is treated with antibiotics. I have prescribed Augmentin 830m/125mg one tablet twice daily with food, for 7 days. You may use an oral decongestant such as Mucinex D or if you have glaucoma or high blood pressure use plain Mucinex. Saline nasal spray help and can safely be used as often as needed for congestion.  If you develop worsening sinus pain, fever or notice severe headache and vision changes, or if symptoms are not better after completion of antibiotic, please schedule an appointment with a health care provider.    Sinus infections are not as easily transmitted as other respiratory infection, however we still recommend that you avoid close contact with loved ones, especially the very young and elderly.  Remember to wash your hands thoroughly throughout the day as this is the number one way to prevent the spread of infection!  Home Care: Only take medications as instructed by your medical team. Complete the entire course of an antibiotic. Do not take these medications with alcohol. A steam or ultrasonic humidifier can help congestion.  You can place a towel over your head and breathe in the steam from hot water coming from a faucet. Avoid close contacts especially the very young and the elderly. Cover your mouth when you cough or sneeze. Always remember to wash your hands.  Get Help Right Away If: You develop worsening fever or sinus pain. You develop a severe head ache or visual changes. Your symptoms persist after you have completed your treatment plan.  Make sure you Understand these instructions. Will watch  your condition. Will get help right away if you are not doing well or get worse.  Thank you for choosing an e-visit.  Your e-visit answers were reviewed by a board certified advanced clinical practitioner to complete your personal care plan. Depending upon the condition, your plan could have included both over the counter or prescription medications.  Please review your pharmacy choice. Make sure the pharmacy is open so you can pick up prescription now. If there is a problem, you may contact your provider through [employer] have the prescription routed to another pharmacy.  Your safety is important to [place] If you have drug allergies check your prescription carefully.   For the next 24 hours you can use MyChart to ask questions about today's visit, request a non-urgent call back, or ask for a work or school excuse. You will get an email in the next two days asking about your experience. I hope that your e-visit has been valuable and will speed your recovery.  I provided 5 minutes of non face-to-face time during this encounter for chart review, medication and order placement, as well as and documentation.

## 2021-09-05 ENCOUNTER — Other Ambulatory Visit: Payer: Self-pay | Admitting: Internal Medicine

## 2021-09-05 DIAGNOSIS — I10 Essential (primary) hypertension: Secondary | ICD-10-CM

## 2021-10-18 ENCOUNTER — Telehealth: Payer: BC Managed Care – PPO | Admitting: Physician Assistant

## 2021-10-18 DIAGNOSIS — J301 Allergic rhinitis due to pollen: Secondary | ICD-10-CM

## 2021-10-18 DIAGNOSIS — R051 Acute cough: Secondary | ICD-10-CM

## 2021-10-18 MED ORDER — BENZONATATE 100 MG PO CAPS: 100.0000 mg | ORAL_CAPSULE | Freq: Three times a day (TID) | ORAL | 0 refills | Status: DC | PRN

## 2021-10-18 MED ORDER — AZELASTINE HCL 0.1 % NA SOLN: 2.0000 | Freq: Two times a day (BID) | NASAL | 0 refills | Status: DC

## 2021-10-18 NOTE — Addendum Note (Signed)
Addended by: Mar Daring on: 10/18/2021 09:44 AM   Modules accepted: Orders

## 2021-10-18 NOTE — Progress Notes (Signed)
E visit for Allergic Rhinitis We are sorry that you are not feeling well.  Here is how we plan to help!  Based on what you have shared with me it looks like you have Allergic Rhinitis.  Rhinitis is when a reaction occurs that causes nasal congestion, runny nose, sneezing, and itching.  Most types of rhinitis are caused by an inflammation and are associated with symptoms in the eyes ears or throat. There are several types of rhinitis.  The most common are acute rhinitis, which is usually caused by a viral illness, allergic or seasonal rhinitis, and nonallergic or year-round rhinitis.  Nasal allergies occur certain times of the year.  Allergic rhinitis is caused when allergens in the air trigger the release of histamine in the body.  Histamine causes itching, swelling, and fluid to build up in the fragile linings of the nasal passages, sinuses and eyelids.  An itchy nose and clear discharge are common.  I also would recommend a nasal spray: Saline 1 spray into each nostril as needed And I will prescribe Astelin nasal spray 2 sprays each nostril twice daily.  HOME CARE:  You can use an over-the-counter saline nasal spray as needed Avoid areas where there is heavy dust, mites, or molds Stay indoors on windy days during the pollen season Keep windows closed in home, at least in bedroom; use air conditioner. Use high-efficiency house air filter Keep windows closed in car, turn AC on re-circulate Avoid playing out with dog during pollen season  GET HELP RIGHT AWAY IF:  If your symptoms do not improve within 10 days You become short of breath You develop yellow or green discharge from your nose for over 3 days You have coughing fits  MAKE SURE YOU:  Understand these instructions Will watch your condition Will get help right away if you are not doing well or get worse  Thank you for choosing an e-visit. Your e-visit answers were reviewed by a board certified advanced clinical practitioner to  complete your personal care plan. Depending upon the condition, your plan could have included both over the counter or prescription medications. Please review your pharmacy choice. Be sure that the pharmacy you have chosen is open so that you can pick up your prescription now.  If there is a problem you may message your provider in [place] to have the prescription routed to another pharmacy. Your safety is important to [place]. If you have drug allergies check your prescription carefully.  For the next 24 hours, you can use MyChart to ask questions about todays visit, request a non-urgent call back, or ask for a work or school excuse from your e-visit provider. You will get an email in the next two days asking about your experience. I hope that your e-visit has been valuable and will speed your recovery.  I provided 5 minutes of non face-to-face time during this encounter for chart review and documentation.

## 2021-12-16 ENCOUNTER — Other Ambulatory Visit: Payer: Self-pay | Admitting: Internal Medicine

## 2021-12-16 ENCOUNTER — Telehealth: Payer: Self-pay | Admitting: Internal Medicine

## 2021-12-16 DIAGNOSIS — J301 Allergic rhinitis due to pollen: Secondary | ICD-10-CM

## 2021-12-16 DIAGNOSIS — J069 Acute upper respiratory infection, unspecified: Secondary | ICD-10-CM

## 2021-12-16 MED ORDER — FLUTICASONE PROPIONATE 50 MCG/ACT NA SUSP: 2.0000 | Freq: Every day | NASAL | 1 refills | Status: DC

## 2021-12-16 NOTE — Telephone Encounter (Signed)
1.Medication Requested: fluticasone (FLONASE) 50 MCG/ACT nasal spray ? ?2. Pharmacy (Name[facility]): [facility] [address] ? ?3. On Med List: Y ? ?4. Last Visit with PCP: 02-08-2021 ? ?5. Next visit date with PCP: n/a ? ?Pt requesting a new rx, provider is not prescribing provider ?

## 2022-01-05 DIAGNOSIS — Z6830 Body mass index (BMI) 30.0-30.9, adult: Secondary | ICD-10-CM | POA: Diagnosis not present

## 2022-01-05 DIAGNOSIS — Z1231 Encounter for screening mammogram for malignant neoplasm of breast: Secondary | ICD-10-CM | POA: Diagnosis not present

## 2022-01-05 DIAGNOSIS — Z3041 Encounter for surveillance of contraceptive pills: Secondary | ICD-10-CM | POA: Diagnosis not present

## 2022-01-05 DIAGNOSIS — Z01411 Encounter for gynecological examination (general) (routine) with abnormal findings: Secondary | ICD-10-CM | POA: Diagnosis not present

## 2022-01-16 ENCOUNTER — Telehealth: Payer: BC Managed Care – PPO | Admitting: Family

## 2022-01-16 DIAGNOSIS — J069 Acute upper respiratory infection, unspecified: Secondary | ICD-10-CM | POA: Diagnosis not present

## 2022-01-16 MED ORDER — FLUTICASONE PROPIONATE 50 MCG/ACT NA SUSP: 2.0000 | Freq: Every day | NASAL | 6 refills | Status: DC

## 2022-01-16 MED ORDER — BENZONATATE 100 MG PO CAPS: 100.0000 mg | ORAL_CAPSULE | Freq: Three times a day (TID) | ORAL | 0 refills | Status: DC | PRN

## 2022-01-16 NOTE — Progress Notes (Signed)
E-Visit for Upper Respiratory Infection  ? ?We are sorry you are not feeling well.  Here is how we plan to help! ? ?Based on what you have shared with me, it looks like you may have a viral upper respiratory infection.  Upper respiratory infections are caused by a large number of viruses; however, rhinovirus is the most common cause.  ? ?Symptoms vary from person to person, with common symptoms including sore throat, cough, fatigue or lack of energy and feeling of general discomfort.  A low-grade fever of up to 100.4 may present, but is often uncommon.  Symptoms vary however, and are closely related to a person's age or underlying illnesses.  The most common symptoms associated with an upper respiratory infection are nasal discharge or congestion, cough, sneezing, headache and pressure in the ears and face.  These symptoms usually persist for about 3 to 10 days, but can last up to 2 weeks.  It is important to know that upper respiratory infections do not cause serious illness or complications in most cases.   ? ?Upper respiratory infections can be transmitted from person to person, with the most common method of transmission being a person's hands.  The virus is able to live on the skin and can infect other persons for up to 2 hours after direct contact.  Also, these can be transmitted when someone coughs or sneezes; thus, it is important to cover the mouth to reduce this risk.  To keep the spread of the illness at [place], good hand hygiene is very important. ? ?This is an infection that is most likely caused by a virus. There are no specific treatments other than to help you with the symptoms until the infection runs its course.  We are sorry you are not feeling well.  Here is how we plan to help! ? ? ?For nasal congestion, you may use an oral decongestants such as Mucinex D or if you have glaucoma or high blood pressure use plain Mucinex.  Saline nasal spray or nasal drops can help and can safely be used as often as  needed for congestion.  For your congestion, I have prescribed Fluticasone nasal spray one spray in each nostril twice a day ? ?If you do not have a history of heart disease, hypertension, diabetes or thyroid disease, prostate/bladder issues or glaucoma, you may also use Sudafed to treat nasal congestion.  It is highly recommended that you consult with a pharmacist or your primary care physician to ensure this medication is safe for you to take.    ? ?If you have a cough, you may use cough suppressants such as Delsym and Robitussin.  If you have glaucoma or high blood pressure, you can also use Coricidin HBP.   ?For cough I have prescribed for you A prescription cough medication called Tessalon Perles 100 mg. You may take 1-2 capsules every 8 hours as needed for cough ? ?If you have a sore or scratchy throat, use a saltwater gargle- ? to ? teaspoon of salt dissolved in a 4-ounce to 8-ounce glass of warm water.  Gargle the solution for approximately 15-30 seconds and then spit.  It is important not to swallow the solution.  You can also use throat lozenges/cough drops and Chloraseptic spray to help with throat pain or discomfort.  Warm or cold liquids can also be helpful in relieving throat pain. ? ?For headache, pain or general discomfort, you can use Ibuprofen or Tylenol as directed.   ?Some authorities believe  that zinc sprays or the use of Echinacea may shorten the course of your symptoms. ? ? ?HOME CARE ?Only take medications as instructed by your medical team. ?Be sure to drink plenty of fluids. Water is fine as well as fruit juices, sodas and electrolyte beverages. You may want to stay away from caffeine or alcohol. If you are nauseated, try taking small sips of liquids. How do you know if you are getting enough fluid? Your urine should be a pale yellow or almost colorless. ?Get rest. ?Taking a steamy shower or using a humidifier may help nasal congestion and ease sore throat pain. You can place a towel over  your head and breathe in the steam from hot water coming from a faucet. ?Using a saline nasal spray works much the same way. ?Cough drops, hard candies and sore throat lozenges may ease your cough. ?Avoid close contacts especially the very young and the elderly ?Cover your mouth if you cough or sneeze ?Always remember to wash your hands.  ? ?GET HELP RIGHT AWAY IF: ?You develop worsening fever. ?If your symptoms do not improve within 10 days ?You develop yellow or green discharge from your nose over 3 days. ?You have coughing fits ?You develop a severe head ache or visual changes. ?You develop shortness of breath, difficulty breathing or start having chest pain ?Your symptoms persist after you have completed your treatment plan ? ?MAKE SURE YOU  ?Understand these instructions. ?Will watch your condition. ?Will get help right away if you are not doing well or get worse. ? ?Thank you for choosing an e-visit. ? ?Your e-visit answers were reviewed by a board certified advanced clinical practitioner to complete your personal care plan. Depending upon the condition, your plan could have included both over the counter or prescription medications. ? ?Please review your pharmacy choice. Make sure the pharmacy is open so you can pick up prescription now. If there is a problem, you may contact your provider through [employer] and have the prescription routed to another pharmacy.  Your safety is important to [place]. If you have drug allergies check your prescription carefully.  ? ?For the next 24 hours you can use MyChart to ask questions about today's visit, request a non-urgent call back, or ask for a work or school excuse. ?You will get an email in the next two days asking about your experience. I hope that your e-visit has been valuable and will speed your recovery. ? ? ?Approximately 5 minutes was spent documenting and reviewing patient's chart.  ? ? ?

## 2022-02-14 ENCOUNTER — Other Ambulatory Visit: Payer: Self-pay | Admitting: Internal Medicine

## 2022-02-14 DIAGNOSIS — I10 Essential (primary) hypertension: Secondary | ICD-10-CM

## 2022-03-02 ENCOUNTER — Encounter: Payer: Self-pay | Admitting: Family Medicine

## 2022-03-02 ENCOUNTER — Ambulatory Visit (INDEPENDENT_AMBULATORY_CARE_PROVIDER_SITE_OTHER): Payer: BC Managed Care – PPO | Admitting: Family Medicine

## 2022-03-02 VITALS — BP 132/84 | HR 84 | Temp 97.4°F | Ht 66.0 in | Wt 184.0 lb

## 2022-03-02 DIAGNOSIS — I10 Essential (primary) hypertension: Secondary | ICD-10-CM | POA: Diagnosis not present

## 2022-03-02 LAB — COMPREHENSIVE METABOLIC PANEL
ALT: 16 U/L (ref 0–35)
AST: 18 U/L (ref 0–37)
Albumin: 4.3 g/dL (ref 3.5–5.2)
Alkaline Phosphatase: 33 U/L — ABNORMAL LOW (ref 39–117)
BUN: 15 mg/dL (ref 6–23)
CO2: 25 mEq/L (ref 19–32)
Calcium: 9.3 mg/dL (ref 8.4–10.5)
Chloride: 105 mEq/L (ref 96–112)
Creatinine, Ser: 0.84 mg/dL (ref 0.40–1.20)
GFR: 81.09 mL/min (ref >60.00)
Glucose, Bld: 103 mg/dL — ABNORMAL HIGH (ref 70–99)
Potassium: 3.9 mEq/L (ref 3.5–5.1)
Sodium: 137 mEq/L (ref 135–145)
Total Bilirubin: 0.5 mg/dL (ref 0.2–1.2)
Total Protein: 7.1 g/dL (ref 6.0–8.3)

## 2022-03-02 LAB — CBC WITH DIFFERENTIAL/PLATELET
Basophils Absolute: 0 10*3/uL (ref 0.0–0.1)
Basophils Relative: 0.5 % (ref 0.0–3.0)
Eosinophils Absolute: 0.2 10*3/uL (ref 0.0–0.7)
Eosinophils Relative: 2.7 % (ref 0.0–5.0)
HCT: 35.4 % — ABNORMAL LOW (ref 36.0–46.0)
Hemoglobin: 12.3 g/dL (ref 12.0–15.0)
Lymphocytes Relative: 34.5 % (ref 12.0–46.0)
Lymphs Abs: 3 10*3/uL (ref 0.7–4.0)
MCHC: 34.7 g/dL (ref 30.0–36.0)
MCV: 100 fl (ref 78.0–100.0)
Monocytes Absolute: 0.6 10*3/uL (ref 0.1–1.0)
Monocytes Relative: 7.5 % (ref 3.0–12.0)
Neutro Abs: 4.8 10*3/uL (ref 1.4–7.7)
Neutrophils Relative %: 54.8 % (ref 43.0–77.0)
Platelets: 321 10*3/uL (ref 150.0–400.0)
RBC: 3.54 Mil/uL — ABNORMAL LOW (ref 3.87–5.11)
RDW: 12 % (ref 11.5–15.5)
WBC: 8.7 10*3/uL (ref 4.0–10.5)

## 2022-03-02 MED ORDER — INDAPAMIDE 1.25 MG PO TABS: 1.2500 mg | ORAL_TABLET | Freq: Every day | ORAL | 0 refills | Status: DC

## 2022-03-02 NOTE — Patient Instructions (Signed)

## 2022-03-02 NOTE — Progress Notes (Signed)
Subjective:     Patient ID: Teresa Blankenship, female    DOB: 02/25/1971, 51 y.o.   MRN: [id number]  Chief Complaint  Patient presents with   Medication Refill    HPI Patient is in today for medication refill.   She is taking indapamide for hypertension.  Last visit was more than a year ago. Occasionally checks her blood pressure at home and states the readings are usually fine.  Reports eating a low-sodium diet in general.  Denies any concerns.  No headaches, dizziness, chest pain, palpitations, shortness of breath, abdominal pain, nausea, vomiting or diarrhea, no lower extremity edema.    Health Maintenance Due  Topic Date Due   COVID-19 Vaccine (4 - Booster) 03/09/2020   Zoster Vaccines- Shingrix (1 of 2) Never done    Past Medical History:  Diagnosis Date   Allergy    seasonal   Anemia    Hypertension     Past Surgical History:  Procedure Laterality Date   SHOULDER SURGERY Left    WISDOM TOOTH EXTRACTION      Family History  Problem Relation Age of Onset   Colon polyps Mother    Cancer Mother    Diabetes Mother    Heart disease Father    Hypertension Brother    Colon cancer Neg Hx    Esophageal cancer Neg Hx    Rectal cancer Neg Hx    Stomach cancer Neg Hx     Social History   Socioeconomic History   Marital status: Married    Spouse name: Not on file   Number of children: Not on file   Years of education: Not on file   Highest education level: Not on file  Occupational History   Not on file  Tobacco Use   Smoking status: Never    Passive exposure: Never   Smokeless tobacco: Never  Vaping Use   Vaping Use: Never used  Substance and Sexual Activity   Alcohol use: Yes    Alcohol/week: 9.0 standard drinks    Types: 9 Shots of liquor per week    Comment: 1-3 drinks 3 days a week   Drug use: Yes    Frequency: 5.0 times per week    Types: Marijuana   Sexual activity: Yes    Birth control/protection: Condom, Pill  Other Topics Concern    Not on file  Social History Narrative   Not on file   Social Determinants of Health   Financial Resource Strain: Not on file  Food Insecurity: Not on file  Transportation Needs: Not on file  Physical Activity: Not on file  Stress: Not on file  Social Connections: Not on file  Intimate Partner Violence: Not on file    Outpatient Medications Prior to Visit  Medication Sig Dispense Refill   azelastine (ASTELIN) 0.1 % nasal spray Place 2 sprays into both nostrils 2 (two) times daily. Use in each nostril as directed 30 mL 0   drospirenone-ethinyl estradiol (YAZ) 3-0.02 MG tablet Take 1 tablet by mouth daily.     Ferrous Sulfate (IRON) 325 (65 Fe) MG TABS Take by mouth daily.     fluticasone (FLONASE) 50 MCG/ACT nasal spray Place 2 sprays into both nostrils daily. 16 g 6   indapamide (LOZOL) 1.25 MG tablet TAKE 1 TABLET BY MOUTH EVERY DAY 90 tablet 0   levocetirizine (XYZAL) 5 MG tablet Take 1 tablet (5 mg total) by mouth every evening. 90 tablet 1   Multiple Vitamins-Minerals (WOMENS DAILY  FORMULA PO) Take by mouth.     vitamin C (ASCORBIC ACID) 500 MG tablet Take 500 mg by mouth daily.     VITAMIN D PO Take by mouth.     Zinc 30 MG TABS Take by mouth.     benzonatate (TESSALON PERLES) 100 MG capsule Take 1 capsule (100 mg total) by mouth 3 (three) times daily as needed. 20 capsule 0   No facility-administered medications prior to visit.    No Known Allergies  ROS Pertinent positives and negatives in the history of present illness.      Objective:    Physical Exam  BP 132/84 (BP Location: Left Arm, Patient Position: Sitting, Cuff Size: Large)   Pulse 84   Temp (!) 97.4 F (36.3 C) (Temporal)   Ht 5' 6"  (1.676 m)   Wt 184 lb (83.5 kg)   SpO2 100%   BMI 29.70 kg/m  Wt Readings from Last 3 Encounters:  03/02/22 184 lb (83.5 kg)  05/14/21 189 lb (85.7 kg)  04/30/21 189 lb (85.7 kg)   Alert and oriented and in no distress. Cardiac exam shows a regular rate and rhythm.  Lungs are clear to auscultation.  Extremities without edema.  Skin is warm dry     Assessment & Plan:   Problem List Items Addressed This Visit       Cardiovascular and Mediastinum   Essential hypertension   She is here today for medication refill for hypertension.  Previous visit more than 1 year ago including labs.  Blood pressure is very close to goal range today.  Labs ordered.  Medication refilled for 90 days and she will follow-up with her PCP prior to her next refill request. DASH diet handout provided.  I have discontinued Jazline D. Pellegrin's benzonatate. I am also having her maintain her VITAMIN D PO, drospirenone-ethinyl estradiol, Iron, Multiple Vitamins-Minerals (WOMENS DAILY FORMULA PO), Zinc, vitamin C, levocetirizine, indapamide, azelastine, and fluticasone.  No orders of the defined types were placed in this encounter.

## 2022-04-07 ENCOUNTER — Other Ambulatory Visit: Payer: Self-pay | Admitting: Internal Medicine

## 2022-04-07 DIAGNOSIS — J301 Allergic rhinitis due to pollen: Secondary | ICD-10-CM

## 2022-04-28 ENCOUNTER — Telehealth: Payer: BC Managed Care – PPO | Admitting: Physician Assistant

## 2022-04-28 DIAGNOSIS — R3989 Other symptoms and signs involving the genitourinary system: Secondary | ICD-10-CM | POA: Diagnosis not present

## 2022-04-28 MED ORDER — CEPHALEXIN 500 MG PO CAPS: 500.0000 mg | ORAL_CAPSULE | Freq: Two times a day (BID) | ORAL | 0 refills | Status: DC

## 2022-04-28 NOTE — Progress Notes (Signed)

## 2022-06-08 ENCOUNTER — Encounter: Payer: BC Managed Care – PPO | Admitting: Internal Medicine

## 2022-06-15 ENCOUNTER — Encounter: Payer: BC Managed Care – PPO | Admitting: Internal Medicine

## 2022-07-20 ENCOUNTER — Encounter: Payer: Self-pay | Admitting: Internal Medicine

## 2022-07-20 ENCOUNTER — Ambulatory Visit (INDEPENDENT_AMBULATORY_CARE_PROVIDER_SITE_OTHER): Payer: BC Managed Care – PPO | Admitting: Internal Medicine

## 2022-07-20 DIAGNOSIS — I10 Essential (primary) hypertension: Secondary | ICD-10-CM | POA: Diagnosis not present

## 2022-07-20 LAB — CBC WITH DIFFERENTIAL/PLATELET
Basophils Absolute: 0.1 10*3/uL (ref 0.0–0.1)
Basophils Relative: 0.8 % (ref 0.0–3.0)
Eosinophils Absolute: 0.2 10*3/uL (ref 0.0–0.7)
Eosinophils Relative: 2.6 % (ref 0.0–5.0)
HCT: 36.7 % (ref 36.0–46.0)
Hemoglobin: 12.3 g/dL (ref 12.0–15.0)
Lymphocytes Relative: 39.3 % (ref 12.0–46.0)
Lymphs Abs: 3.7 10*3/uL (ref 0.7–4.0)
MCHC: 33.6 g/dL (ref 30.0–36.0)
MCV: 102.4 fl — ABNORMAL HIGH (ref 78.0–100.0)
Monocytes Absolute: 0.6 10*3/uL (ref 0.1–1.0)
Monocytes Relative: 6.3 % (ref 3.0–12.0)
Neutro Abs: 4.8 10*3/uL (ref 1.4–7.7)
Neutrophils Relative %: 51 % (ref 43.0–77.0)
Platelets: 359 10*3/uL (ref 150.0–400.0)
RBC: 3.59 Mil/uL — ABNORMAL LOW (ref 3.87–5.11)
RDW: 12.4 % (ref 11.5–15.5)
WBC: 9.5 10*3/uL (ref 4.0–10.5)

## 2022-07-20 LAB — BASIC METABOLIC PANEL
BUN: 19 mg/dL (ref 6–23)
CO2: 28 mEq/L (ref 19–32)
Calcium: 9.8 mg/dL (ref 8.4–10.5)
Chloride: 102 mEq/L (ref 96–112)
Creatinine, Ser: 0.86 mg/dL (ref 0.40–1.20)
GFR: 78.62 mL/min (ref >60.00)
Glucose, Bld: 97 mg/dL (ref 70–99)
Potassium: 4.1 mEq/L (ref 3.5–5.1)
Sodium: 137 mEq/L (ref 135–145)

## 2022-07-20 MED ORDER — INDAPAMIDE 1.25 MG PO TABS: 1.2500 mg | ORAL_TABLET | Freq: Every day | ORAL | 0 refills | Status: DC

## 2022-07-20 MED ORDER — NEBIVOLOL HCL 5 MG PO TABS: 5.0000 mg | ORAL_TABLET | Freq: Every day | ORAL | 0 refills | Status: DC

## 2022-07-20 NOTE — Patient Instructions (Signed)
Hypertension, Adult High blood pressure (hypertension) is when the force of blood pumping through the arteries is too strong. The arteries are the blood vessels that carry blood from the heart throughout the body. Hypertension forces the heart to work harder to pump blood and may cause arteries to become narrow or stiff. Untreated or uncontrolled hypertension can lead to a heart attack, heart failure, a stroke, kidney disease, and other problems. A blood pressure reading consists of a higher number over a lower number. Ideally, your blood pressure should be below 120/80. The first ("top") number is called the systolic pressure. It is a measure of the pressure in your arteries as your heart beats. The second ("bottom") number is called the diastolic pressure. It is a measure of the pressure in your arteries as the heart relaxes. What are the causes? The exact cause of this condition is not known. There are some conditions that result in high blood pressure. What increases the risk? Certain factors may make you more likely to develop high blood pressure. Some of these risk factors are under your control, including: Smoking. Not getting enough exercise or physical activity. Being overweight. Having too much fat, sugar, calories, or salt (sodium) in your diet. Drinking too much alcohol. Other risk factors include: Having a personal history of heart disease, diabetes, high cholesterol, or kidney disease. Stress. Having a family history of high blood pressure and high cholesterol. Having obstructive sleep apnea. Age. The risk increases with age. What are the signs or symptoms? High blood pressure may not cause symptoms. Very high blood pressure (hypertensive crisis) may cause: Headache. Fast or irregular heartbeats (palpitations). Shortness of breath. Nosebleed. Nausea and vomiting. Vision changes. Severe chest pain, dizziness, and seizures. How is this diagnosed? This condition is diagnosed by  measuring your blood pressure while you are seated, with your arm resting on a flat surface, your legs uncrossed, and your feet flat on the floor. The cuff of the blood pressure monitor will be placed directly against the skin of your upper arm at the level of your heart. Blood pressure should be measured at least twice using the same arm. Certain conditions can cause a difference in blood pressure between your right and left arms. If you have a high blood pressure reading during one visit or you have normal blood pressure with other risk factors, you may be asked to: Return on a different day to have your blood pressure checked again. Monitor your blood pressure at home for 1 week or longer. If you are diagnosed with hypertension, you may have other blood or imaging tests to help your health care provider understand your overall risk for other conditions. How is this treated? This condition is treated by making healthy lifestyle changes, such as eating healthy foods, exercising more, and reducing your alcohol intake. You may be referred for counseling on a healthy diet and physical activity. Your health care provider may prescribe medicine if lifestyle changes are not enough to get your blood pressure under control and if: Your systolic blood pressure is above 130. Your diastolic blood pressure is above 80. Your personal target blood pressure may vary depending on your medical conditions, your age, and other factors. Follow these instructions at home: Eating and drinking  Eat a diet that is high in fiber and potassium, and low in sodium, added sugar, and fat. An example of this eating plan is called the DASH diet. DASH stands for Dietary Approaches to Stop Hypertension. To eat this way: Eat   plenty of fresh fruits and vegetables. Try to fill one half of your plate at each meal with fruits and vegetables. Eat whole grains, such as whole-wheat pasta, brown rice, or whole-grain bread. Fill about one  fourth of your plate with whole grains. Eat or drink low-fat dairy products, such as skim milk or low-fat yogurt. Avoid fatty cuts of meat, processed or cured meats, and poultry with skin. Fill about one fourth of your plate with lean proteins, such as fish, chicken without skin, beans, eggs, or tofu. Avoid pre-made and processed foods. These tend to be higher in sodium, added sugar, and fat. Reduce your daily sodium intake. Many people with hypertension should eat less than 1,500 mg of sodium a day. Do not drink alcohol if: Your health care provider tells you not to drink. You are pregnant, may be pregnant, or are planning to become pregnant. If you drink alcohol: Limit how much you have to: 0-1 drink a day for women. 0-2 drinks a day for men. Know how much alcohol is in your drink. In the [place] one drink equals one 12 oz bottle of beer (355 mL), one 5 oz glass of wine (148 mL), or one 1 oz glass of hard liquor (44 mL). Lifestyle  Work with your health care provider to maintain a healthy body weight or to lose weight. Ask what an ideal weight is for you. Get at least 30 minutes of exercise that causes your heart to beat faster (aerobic exercise) most days of the week. Activities may include walking, swimming, or biking. Include exercise to strengthen your muscles (resistance exercise), such as Pilates or lifting weights, as part of your weekly exercise routine. Try to do these types of exercises for 30 minutes at least 3 days a week. Do not use any products that contain nicotine or tobacco. These products include cigarettes, chewing tobacco, and vaping devices, such as e-cigarettes. If you need help quitting, ask your health care provider. Monitor your blood pressure at home as told by your health care provider. Keep all follow-up visits. This is important. Medicines Take over-the-counter and prescription medicines only as told by your health care provider. Follow directions carefully. Blood  pressure medicines must be taken as prescribed. Do not skip doses of blood pressure medicine. Doing this puts you at risk for problems and can make the medicine less effective. Ask your health care provider about side effects or reactions to medicines that you should watch for. Contact a health care provider if you: Think you are having a reaction to a medicine you are taking. Have headaches that keep coming back (recurring). Feel dizzy. Have swelling in your ankles. Have trouble with your vision. Get help right away if you: Develop a severe headache or confusion. Have unusual weakness or numbness. Feel faint. Have severe pain in your chest or abdomen. Vomit repeatedly. Have trouble breathing. These symptoms may be an emergency. Get help right away. Call 911. Do not wait to see if the symptoms will go away. Do not drive yourself to the hospital. Summary Hypertension is when the force of blood pumping through your arteries is too strong. If this condition is not controlled, it may put you at risk for serious complications. Your personal target blood pressure may vary depending on your medical conditions, your age, and other factors. For most people, a normal blood pressure is less than 120/80. Hypertension is treated with lifestyle changes, medicines, or a combination of both. Lifestyle changes include losing weight, eating a healthy,   low-sodium diet, exercising more, and limiting alcohol. This information is not intended to replace advice given to you by your health care provider. Make sure you discuss any questions you have with your health care provider. Document Revised: 08/03/2021 Document Reviewed: 08/03/2021 Elsevier Patient Education  2023 Else[employer] Inc[employer]  

## 2022-07-20 NOTE — Progress Notes (Signed)
Subjective:  Patient ID: Teresa Blankenship, female    DOB: 08-18-1971  Age: 51 y.o. MRN: [id number]  CC: Hypertension   HPI SOCORRO KANITZ presents for f/up -  She does not exercise or monitor her blood pressure.  She tells me she is compliant with indapamide.  She denies headache, blurred vision, chest pain, shortness of breath, or edema.  Outpatient Medications Prior to Visit  Medication Sig Dispense Refill   azelastine (ASTELIN) 0.1 % nasal spray Place 2 sprays into both nostrils 2 (two) times daily. Use in each nostril as directed 30 mL 0   drospirenone-ethinyl estradiol (YAZ) 3-0.02 MG tablet Take 1 tablet by mouth daily.     Ferrous Sulfate (IRON) 325 (65 Fe) MG TABS Take by mouth daily.     fluticasone (FLONASE) 50 MCG/ACT nasal spray Place 2 sprays into both nostrils daily. 16 g 6   levocetirizine (XYZAL) 5 MG tablet TAKE 1 TABLET BY MOUTH EVERY DAY IN THE EVENING 90 tablet 1   Multiple Vitamins-Minerals (WOMENS DAILY FORMULA PO) Take by mouth.     vitamin C (ASCORBIC ACID) 500 MG tablet Take 500 mg by mouth daily.     VITAMIN D PO Take by mouth.     Zinc 30 MG TABS Take by mouth.     cephALEXin (KEFLEX) 500 MG capsule Take 1 capsule (500 mg total) by mouth 2 (two) times daily. 14 capsule 0   indapamide (LOZOL) 1.25 MG tablet Take 1 tablet (1.25 mg total) by mouth daily. 90 tablet 0   No facility-administered medications prior to visit.    ROS Review of Systems  Constitutional: Negative.  Negative for diaphoresis and fatigue.  HENT: Negative.    Eyes: Negative.   Respiratory:  Negative for cough, chest tightness, shortness of breath and wheezing.   Cardiovascular:  Negative for chest pain, palpitations and leg swelling.  Gastrointestinal:  Negative for abdominal pain, nausea and vomiting.  Endocrine: Negative.   Genitourinary: Negative.  Negative for difficulty urinating and hematuria.  Musculoskeletal: Negative.  Negative for arthralgias and myalgias.  Skin: Negative.    Neurological:  Negative for dizziness, weakness and headaches.  Hematological:  Negative for adenopathy. Does not bruise/bleed easily.  Psychiatric/Behavioral: Negative.      Objective:  BP (!) 152/88 (BP Location: Right Arm, Patient Position: Sitting, Cuff Size: Large)   Pulse 88   Temp 98.8 F (37.1 C) (Oral)   Resp 16   Ht '5\' 6"'$  (1.676 m)   Wt 188 lb (85.3 kg)   SpO2 97%   BMI 30.34 kg/m   BP Readings from Last 3 Encounters:  07/20/22 (!) 152/88  03/02/22 132/84  05/14/21 (!) 157/91    Wt Readings from Last 3 Encounters:  07/20/22 188 lb (85.3 kg)  03/02/22 184 lb (83.5 kg)  05/14/21 189 lb (85.7 kg)    Physical Exam Vitals reviewed.  HENT:     Nose: Nose normal.     Mouth/Throat:     Mouth: Mucous membranes are moist.  Eyes:     General: No scleral icterus.    Conjunctiva/sclera: Conjunctivae normal.  Cardiovascular:     Rate and Rhythm: Normal rate and regular rhythm.     Heart sounds: No murmur heard. Pulmonary:     Effort: Pulmonary effort is normal.     Breath sounds: No stridor. No wheezing, rhonchi or rales.  Abdominal:     General: Abdomen is flat.     Palpations: There is no mass.  Tenderness: There is no abdominal tenderness. There is no guarding.     Hernia: No hernia is present.  Musculoskeletal:        General: Normal range of motion.     Cervical back: Neck supple.     Right lower leg: No edema.     Left lower leg: No edema.  Lymphadenopathy:     Cervical: No cervical adenopathy.  Skin:    General: Skin is warm and dry.  Neurological:     General: No focal deficit present.     Mental Status: She is alert.  Psychiatric:        Mood and Affect: Mood normal.        Behavior: Behavior normal.     Lab Results  Component Value Date   WBC 9.5 07/20/2022   HGB 12.3 07/20/2022   HCT 36.7 07/20/2022   PLT 359.0 07/20/2022   GLUCOSE 97 07/20/2022   CHOL 137 02/08/2021   TRIG 207.0 (H) 02/08/2021   HDL 55.60 02/08/2021   LDLDIRECT  59.0 02/08/2021   ALT 16 03/02/2022   AST 18 03/02/2022   NA 137 07/20/2022   K 4.1 07/20/2022   CL 102 07/20/2022   CREATININE 0.86 07/20/2022   BUN 19 07/20/2022   CO2 28 07/20/2022   TSH 0.37 02/08/2021   INR 1.0 09/18/2019   HGBA1C 4.7 06/27/2018    MR Cervical Spine w/o contrast  Result Date: 05/08/2017 CLINICAL DATA:  51 y/o  F; neck pain radicular arm pain. EXAM: MRI CERVICAL SPINE WITHOUT CONTRAST TECHNIQUE: Multiplanar, multisequence MR imaging of the cervical spine was performed. No intravenous contrast was administered. COMPARISON:  04/26/2017 cervical spine radiographs. FINDINGS: Alignment: Straightening of cervical lordosis without listhesis. Vertebrae: No fracture, evidence of discitis, or bone lesion. Cord: Normal signal and morphology. Posterior Fossa, vertebral arteries, paraspinal tissues: Negative. Disc levels: C2-3: No significant disc displacement, foraminal narrowing, or canal stenosis. C3-4: Small disc bulge eccentric to the left with mild left foraminal stenosis. No significant canal stenosis. C4-5: Small disc osteophyte complex with prominent right-sided uncovertebral hypertrophy. Mild left and moderate to severe right foraminal stenosis. No significant canal stenosis. C5-6: Moderate disc osteophyte complex with bilateral uncovertebral and facet hypertrophy. Moderate bilateral foraminal stenosis and mild canal stenosis with anterior cord impingement and flattening. C6-7: Disc osteophyte complex with left-greater-than-right uncovertebral and facet hypertrophy. Moderate to severe left and mild right foraminal stenosis. Right central 6 mm disc protrusion impinges the right anterior cord with cord flattening and moderate to severe canal stenosis. C7-T1: No significant disc displacement, foraminal narrowing, or canal stenosis. IMPRESSION: 1. C6-7 right central disc protrusion with right anterior cord impingement, cord flattening, and moderate to severe canal stenosis. 2. Multiple  levels of mild foraminal stenosis with moderate to severe right C4-5, moderate bilateral C5-6, and moderate to severe left C6-7 foraminal stenosis. 3. No acute osseous abnormality.  No abnormal cord signal. Electronically Signed   By: Kristine Garbe M.D.   On: 05/08/2017 01:50    Assessment & Plan:   Smriti was seen today for hypertension.  Diagnoses and all orders for this visit:  Essential hypertension- Her blood pressure is not adequately well controlled.  She was encouraged to improve her lifestyle modifications.  Will add nebivolol to indapamide to try to get better blood pressure control. -     Basic metabolic panel; Future -     CBC with Differential/Platelet; Future -     CBC with Differential/Platelet -     Basic  metabolic panel -     nebivolol (BYSTOLIC) 5 MG tablet; Take 1 tablet (5 mg total) by mouth daily. -     indapamide (LOZOL) 1.25 MG tablet; Take 1 tablet (1.25 mg total) by mouth daily.   I have discontinued Bijou D. Bittick's cephALEXin. I am also having her start on nebivolol. Additionally, I am having her maintain her VITAMIN D PO, drospirenone-ethinyl estradiol, Iron, Multiple Vitamins-Minerals (WOMENS DAILY FORMULA PO), Zinc, ascorbic acid, azelastine, fluticasone, levocetirizine, and indapamide.  Meds ordered this encounter  Medications   nebivolol (BYSTOLIC) 5 MG tablet    Sig: Take 1 tablet (5 mg total) by mouth daily.    Dispense:  90 tablet    Refill:  0   indapamide (LOZOL) 1.25 MG tablet    Sig: Take 1 tablet (1.25 mg total) by mouth daily.    Dispense:  90 tablet    Refill:  0     Follow-up: Return in about 6 months (around 01/19/2023).  Scarlette Calico, MD

## 2022-07-22 ENCOUNTER — Encounter: Payer: Self-pay | Admitting: Internal Medicine

## 2022-09-14 ENCOUNTER — Telehealth: Payer: BC Managed Care – PPO | Admitting: Physician Assistant

## 2022-09-14 DIAGNOSIS — B9689 Other specified bacterial agents as the cause of diseases classified elsewhere: Secondary | ICD-10-CM | POA: Diagnosis not present

## 2022-09-14 DIAGNOSIS — J019 Acute sinusitis, unspecified: Secondary | ICD-10-CM

## 2022-09-14 MED ORDER — DOXYCYCLINE HYCLATE 100 MG PO TABS: 100.0000 mg | ORAL_TABLET | Freq: Two times a day (BID) | ORAL | 0 refills | Status: DC

## 2022-09-14 NOTE — Progress Notes (Signed)
I have spent 5 minutes in review of e-visit questionnaire, review and updating patient chart, medical decision making and response to patient.   Avabella Wailes Cody Correna Meacham, PA-C    

## 2022-09-14 NOTE — Progress Notes (Signed)

## 2022-10-17 ENCOUNTER — Ambulatory Visit: Payer: BC Managed Care – PPO

## 2022-10-18 ENCOUNTER — Other Ambulatory Visit: Payer: Self-pay | Admitting: Internal Medicine

## 2022-10-18 DIAGNOSIS — I10 Essential (primary) hypertension: Secondary | ICD-10-CM

## 2022-10-22 ENCOUNTER — Telehealth: Payer: BC Managed Care – PPO | Admitting: Nurse Practitioner

## 2022-10-22 DIAGNOSIS — J3089 Other allergic rhinitis: Secondary | ICD-10-CM | POA: Diagnosis not present

## 2022-10-22 MED ORDER — BENZONATATE 100 MG PO CAPS: 100.0000 mg | ORAL_CAPSULE | Freq: Two times a day (BID) | ORAL | 0 refills | Status: DC | PRN

## 2022-10-22 MED ORDER — FLUTICASONE PROPIONATE 50 MCG/ACT NA SUSP: 2.0000 | Freq: Every day | NASAL | 6 refills | Status: DC

## 2022-10-22 NOTE — Progress Notes (Signed)
E visit for Allergic Rhinitis We are sorry that you are not feeling well.  Here is how we plan to help!  Based on what you have shared with me it looks like you have Allergic Rhinitis.  Rhinitis is when a reaction occurs that causes nasal congestion, runny nose, sneezing, and itching.  Most types of rhinitis are caused by an inflammation and are associated with symptoms in the eyes ears or throat. There are several types of rhinitis.  The most common are acute rhinitis, which is usually caused by a viral illness, allergic or seasonal rhinitis, and nonallergic or year-round rhinitis.  Nasal allergies occur certain times of the year.  Allergic rhinitis is caused when allergens in the air trigger the release of histamine in the body.  Histamine causes itching, swelling, and fluid to build up in the fragile linings of the nasal passages, sinuses and eyelids.  An itchy nose and clear discharge are common.  I recommend the following over the counter treatments: Claritin '10mg'$  1 daily  Tessalon perles for cough  I also would recommend a nasal spray: prescription called in Flonase 2 sprays into each nostril once daily  HOME CARE:  You can use an over-the-counter saline nasal spray as needed Avoid areas where there is heavy dust, mites, or molds Stay indoors on windy days during the pollen season Keep windows closed in home, at least in bedroom; use air conditioner. Use high-efficiency house air filter Keep windows closed in car, turn AC on re-circulate Avoid playing out with dog during pollen season  GET HELP RIGHT AWAY IF:  If your symptoms do not improve within 10 days You become short of breath You develop yellow or green discharge from your nose for over 3 days You have coughing fits  MAKE SURE YOU:  Understand these instructions Will watch your condition Will get help right away if you are not doing well or get worse  Thank you for choosing an e-visit. Your e-visit answers were  reviewed by a board certified advanced clinical practitioner to complete your personal care plan. Depending upon the condition, your plan could have included both over the counter or prescription medications. Please review your pharmacy choice. Be sure that the pharmacy you have chosen is open so that you can pick up your prescription now.  If there is a problem you may message your provider in [place] to have the prescription routed to another pharmacy. Your safety is important to [place]. If you have drug allergies check your prescription carefully.  For the next 24 hours, you can use MyChart to ask questions about today's visit, request a non-urgent call back, or ask for a work or school excuse from your e-visit provider. You will get an email in the next two days asking about your experience. I hope that your e-visit has been valuable and will speed your recovery.   Mary-Margaret Hassell Done, FNP   5-10 minutes spent reviewing and documenting in chart.

## 2022-11-28 ENCOUNTER — Ambulatory Visit (INDEPENDENT_AMBULATORY_CARE_PROVIDER_SITE_OTHER): Payer: BC Managed Care – PPO

## 2022-11-28 ENCOUNTER — Ambulatory Visit (INDEPENDENT_AMBULATORY_CARE_PROVIDER_SITE_OTHER): Payer: BC Managed Care – PPO | Admitting: Orthopedic Surgery

## 2022-11-28 ENCOUNTER — Encounter: Payer: Self-pay | Admitting: Orthopedic Surgery

## 2022-11-28 DIAGNOSIS — M79602 Pain in left arm: Secondary | ICD-10-CM | POA: Diagnosis not present

## 2022-11-28 MED ORDER — METHOCARBAMOL 500 MG PO TABS: 500.0000 mg | ORAL_TABLET | Freq: Three times a day (TID) | ORAL | 0 refills | Status: DC | PRN

## 2022-11-28 MED ORDER — PREDNISONE 5 MG (21) PO TBPK: ORAL_TABLET | ORAL | 0 refills | Status: DC

## 2022-11-28 NOTE — Progress Notes (Unsigned)
Office Visit Note   Patient: Teresa Blankenship           Date of Birth: [birth year]           MRN: [id number] Visit Date: 11/28/2022 Requested by: Janith Lima, MD [address] [place],  [place] [address] PCP: Janith Lima, MD  Subjective: Chief Complaint  Patient presents with   Left Shoulder - Pain   Neck - Pain    HPI: Teresa Blankenship is a 52 y.o. female who presents to the office reporting left shoulder and arm pain for 3 to 4 months.  Denies any history of injury.  Pain radiates down to the hand.  She also describes numbness and tingling in the left arm and hand along with neck pain and painful range of motion of the shoulder.  The pain wakes her from sleep at night.  She has had a prior C-spine ESI with Dr. Ernestina Patches with some relief.  Cervical spine MRI from 2020 did show C6-7 left-sided foraminal stenosis.  Hard for her to take Tylenol or ibuprofen.  She has a history [facility]..                ROS: All systems reviewed are negative as they relate to the chief complaint within the history of present illness.  Patient denies fevers or chills.  Assessment & Plan: Visit Diagnoses:  1. Left arm pain     Plan: Impression is left-sided radiculopathy.  Plan is MRI cervical spine to evaluate progression of left-sided C6-7 foraminal stenosis with possible ESI's to follow.  We will have her follow-up with Dr. Ernestina Patches after that cervical spine MRI scan.  Medrol Dosepak and Robaxin prescribed.  I do not think this really looks like a shoulder structural problem at this time.  Follow-Up Instructions: No follow-ups on file.   Orders:  Orders Placed This Encounter  Procedures   XR Shoulder Left   XR Cervical Spine 2 or 3 views   MR Cervical Spine w/o contrast   Ambulatory referral to Physical Medicine Rehab   Meds ordered this encounter  Medications   predniSONE (STERAPRED UNI-PAK 21 TAB) 5 MG (21) TBPK tablet    Sig: Take dosepak as directed     Dispense:  21 tablet    Refill:  0   methocarbamol (ROBAXIN) 500 MG tablet    Sig: Take 1 tablet (500 mg total) by mouth every 8 (eight) hours as needed for muscle spasms.    Dispense:  30 tablet    Refill:  0      Procedures: No procedures performed   Clinical Data: No additional findings.  Objective: Vital Signs: There were no vitals taken for this visit.  Physical Exam:  Constitutional: Patient appears well-developed HEENT:  Head: Normocephalic Eyes:EOM are normal Neck: Normal range of motion Cardiovascular: Normal rate Pulmonary/chest: Effort normal Neurologic: Patient is alert Skin: Skin is warm Psychiatric: Patient has normal mood and affect  Ortho Exam: Ortho exam demonstrates pretty reasonable cervical spine range of motion.  5 out of 5 grip EPL FPL interosseous wrist flexion extension bicep triceps and deltoid strength.  Patient has good rotator cuff strength infraspinatus supraspinatus and subscap muscle testing.  She does have some mild paresthesias in the C6 distribution on the left compared to the right.  No muscle atrophy or wasting.  Reflexes symmetric bilateral biceps and triceps.  1 to 2+ out of 4.  She does have  radicular symptoms radiating into digits 1 and 2 on the left-hand side.  No coarse grinding or crepitus with internal/external rotation of that left shoulder.  No asymmetric restriction of external rotation left and right shoulder.  Specialty Comments:  No specialty comments available.  Imaging: No results found.   PMFS History: Patient Active Problem List   Diagnosis Date Noted   Positive colorectal cancer screening using Cologuard test 02/26/2021   Primary osteoarthritis of right hip 02/06/2020   Vitamin D deficiency disease 06/28/2018   Menopausal symptom 01/08/2018   Essential hypertension 09/14/2016   Routine general medical examination at a health care facility 09/13/2016   Hyperglycemia 09/13/2016   Allergic rhinitis 01/20/2012    Past Medical History:  Diagnosis Date   Allergy    seasonal   Anemia    Hypertension     Family History  Problem Relation Age of Onset   Colon polyps Mother    Cancer Mother    Diabetes Mother    Heart disease Father    Hypertension Brother    Colon cancer Neg Hx    Esophageal cancer Neg Hx    Rectal cancer Neg Hx    Stomach cancer Neg Hx     Past Surgical History:  Procedure Laterality Date   SHOULDER SURGERY Left    WISDOM TOOTH EXTRACTION     Social History   Occupational History   Not on file  Tobacco Use   Smoking status: Never    Passive exposure: Never   Smokeless tobacco: Never  Vaping Use   Vaping Use: Never used  Substance and Sexual Activity   Alcohol use: Yes    Alcohol/week: 9.0 standard drinks of alcohol    Types: 9 Shots of liquor per week    Comment: 1-3 drinks 3 days a week   Drug use: Yes    Frequency: 5.0 times per week    Types: Marijuana   Sexual activity: Yes    Birth control/protection: Condom, Pill

## 2022-12-16 ENCOUNTER — Other Ambulatory Visit: Payer: BC Managed Care – PPO

## 2022-12-16 ENCOUNTER — Ambulatory Visit
Admission: RE | Admit: 2022-12-16 | Discharge: 2022-12-16 | Disposition: A | Discharge status: Home / Self Care | Payer: BC Managed Care – PPO | Source: Ambulatory Visit | Attending: Orthopedic Surgery | Admitting: Orthopedic Surgery

## 2022-12-16 DIAGNOSIS — M4802 Spinal stenosis, cervical region: Secondary | ICD-10-CM | POA: Diagnosis not present

## 2022-12-16 DIAGNOSIS — M79602 Pain in left arm: Secondary | ICD-10-CM

## 2022-12-16 DIAGNOSIS — M47812 Spondylosis without myelopathy or radiculopathy, cervical region: Secondary | ICD-10-CM | POA: Diagnosis not present

## 2022-12-16 DIAGNOSIS — M542 Cervicalgia: Secondary | ICD-10-CM | POA: Diagnosis not present

## 2022-12-21 ENCOUNTER — Encounter: Payer: Self-pay | Admitting: Orthopedic Surgery

## 2022-12-21 ENCOUNTER — Ambulatory Visit (INDEPENDENT_AMBULATORY_CARE_PROVIDER_SITE_OTHER): Payer: BC Managed Care – PPO | Admitting: Orthopedic Surgery

## 2022-12-21 DIAGNOSIS — M4802 Spinal stenosis, cervical region: Secondary | ICD-10-CM | POA: Diagnosis not present

## 2022-12-21 NOTE — Progress Notes (Signed)
Okay thanks.  She is not myelopathic but I compared the scan to the one from 2018 and in general it has gotten a little bit bigger since then.  I do not think this disc herniation is going anywhere which is another reason she should get surgery but for what ever reason she cannot be out of work right now she does not want to get it.

## 2022-12-21 NOTE — Progress Notes (Signed)
Office Visit Note   Patient: Teresa Blankenship           Date of Birth: Aug 19, 1971           MRN: [id number] Visit Date: 12/21/2022 Requested by: Janith Lima, MD [place],  [place] [address] PCP: Janith Lima, MD  Subjective: Chief Complaint  Patient presents with   Other     Scan review    HPI: Teresa Blankenship is a 52 y.o. female who presents to the office reporting .Continued neck and shoulder symptoms.  Medrol Dosepak helped her symptoms.  Muscle laxer is also helped.  She is very opposed to the idea of surgery because of her current employment situation.  She had a cervical MRI in 2018 and that is compared to the one from today.  Her cervical MRI scan is reviewed with the patient.  He does show slightly increased disc bulge at C5-6 which now results in moderate spinal canal stenosis and unchanged severe bilateral neuroforaminal narrowing at that level.  Patient also has unchanged severe spinal canal stenosis and severe left neuroforaminal narrowing at C6-7.              ROS: All systems reviewed are negative as they relate to the chief complaint within the history of present illness.  Patient denies fevers or chills.  Assessment & Plan: Visit Diagnoses:  1. Spinal stenosis of cervical region     Plan: Impression is cervical spine stenosis without myelopathy on exam.  The upper disc has increased in size which is visible on comparison sagittal views.  My recommendation was for at least a surgical spinal consultation with one of our spine surgeons.  However her current situation precludes any type of surgical consideration.  She would prefer to try a cervical spine ESI which we will do but my strong preference would be at least talking to one of our spine surgeons.  She wants to hold off on that recommendation but we will get her set up to see Dr. Ernestina Patches.  Follow-Up Instructions: No follow-ups on file.   Orders:  No orders of the defined types were placed in  this encounter.  No orders of the defined types were placed in this encounter.     Procedures: No procedures performed   Clinical Data: No additional findings.  Objective: Vital Signs: There were no vitals taken for this visit.  Physical Exam:  Constitutional: Patient appears well-developed HEENT:  Head: Normocephalic Eyes:EOM are normal Neck: Normal range of motion Cardiovascular: Normal rate Pulmonary/chest: Effort normal Neurologic: Patient is alert Skin: Skin is warm Psychiatric: Patient has normal mood and affect  Ortho Exam: Ortho exam demonstrates mild pain with cervical spine range of motion.  5 out of 5 grip EPL FPL interosseous resection extension bicep triceps and deltoid strength.  Negative Hoffman's reflex.  No proximal muscle weakness in the deltoid or hip flexors.  By history her gait is normal and that is also the case on exam.  No hyperreflexia in the upper or lower extremities.  Negative clonus in the lower extremities.  No definite paresthesias C5-T1.  Specialty Comments:  No specialty comments available.  Imaging: No results found.   PMFS History: Patient Active Problem List   Diagnosis Date Noted   Positive colorectal cancer screening using Cologuard test 02/26/2021   Primary osteoarthritis of right hip 02/06/2020   Vitamin D deficiency disease 06/28/2018   Menopausal symptom 01/08/2018   Essential hypertension 09/14/2016  Routine general medical examination at a health care facility 09/13/2016   Hyperglycemia 09/13/2016   Allergic rhinitis 01/20/2012   Past Medical History:  Diagnosis Date   Allergy    seasonal   Anemia    Hypertension     Family History  Problem Relation Age of Onset   Colon polyps Mother    Cancer Mother    Diabetes Mother    Heart disease Father    Hypertension Brother    Colon cancer Neg Hx    Esophageal cancer Neg Hx    Rectal cancer Neg Hx    Stomach cancer Neg Hx     Past Surgical History:  Procedure  Laterality Date   SHOULDER SURGERY Left    WISDOM TOOTH EXTRACTION     Social History   Occupational History   Not on file  Tobacco Use   Smoking status: Never    Passive exposure: Never   Smokeless tobacco: Never  Vaping Use   Vaping Use: Never used  Substance and Sexual Activity   Alcohol use: Yes    Alcohol/week: 9.0 standard drinks of alcohol    Types: 9 Shots of liquor per week    Comment: 1-3 drinks 3 days a week   Drug use: Yes    Frequency: 5.0 times per week    Types: Marijuana   Sexual activity: Yes    Birth control/protection: Condom, Pill

## 2022-12-21 NOTE — Progress Notes (Signed)
Hi Mick.  I tried to have her come see you but she did not want to.  With this level of stenosis what would you tell patients usually

## 2022-12-23 ENCOUNTER — Other Ambulatory Visit: Payer: Self-pay

## 2022-12-23 ENCOUNTER — Telehealth: Payer: Self-pay

## 2022-12-23 DIAGNOSIS — M4802 Spinal stenosis, cervical region: Secondary | ICD-10-CM

## 2022-12-23 NOTE — Telephone Encounter (Signed)
-----   Message from Meredith Pel, MD sent at 12/23/2022  6:16 AM EDT ----- Corrin Parker can you get [facility] set up to see Dr. Ernestina Patches for cervical spine ESI.  Thanks

## 2022-12-23 NOTE — Telephone Encounter (Signed)
Referral placed.

## 2022-12-28 LAB — HM MAMMOGRAPHY: HM Mammogram: NORMAL (ref 0–4)

## 2023-01-04 LAB — HM PAP SMEAR

## 2023-01-14 ENCOUNTER — Other Ambulatory Visit: Payer: Self-pay | Admitting: Internal Medicine

## 2023-01-14 DIAGNOSIS — I10 Essential (primary) hypertension: Secondary | ICD-10-CM

## 2023-02-15 ENCOUNTER — Telehealth: Payer: Self-pay

## 2023-02-15 NOTE — Telephone Encounter (Signed)
Pls review referral msg in your box on this patient. Terri needs response from you to proceed.

## 2023-02-16 NOTE — Telephone Encounter (Signed)
Can not find it

## 2023-03-28 DIAGNOSIS — Z1231 Encounter for screening mammogram for malignant neoplasm of breast: Secondary | ICD-10-CM | POA: Diagnosis not present

## 2023-03-28 DIAGNOSIS — Z01419 Encounter for gynecological examination (general) (routine) without abnormal findings: Secondary | ICD-10-CM | POA: Diagnosis not present

## 2023-06-23 ENCOUNTER — Telehealth: Payer: BC Managed Care – PPO | Admitting: Nurse Practitioner

## 2023-06-23 DIAGNOSIS — J329 Chronic sinusitis, unspecified: Secondary | ICD-10-CM

## 2023-06-23 DIAGNOSIS — B9789 Other viral agents as the cause of diseases classified elsewhere: Secondary | ICD-10-CM

## 2023-06-23 MED ORDER — IPRATROPIUM BROMIDE 0.03 % NA SOLN
2.0000 | Freq: Two times a day (BID) | NASAL | 12 refills | Status: DC
Start: 2023-06-23 — End: 2023-12-06

## 2023-06-23 NOTE — Progress Notes (Signed)
E-Visit for Sinus Problems  We are sorry that you are not feeling well.  Here is how we plan to help!  Based on what you have shared with me it looks like you have sinusitis.  Sinusitis is inflammation and infection in the sinus cavities of the head.  Based on your presentation I believe you most likely have Acute Viral Sinusitis.This is an infection most likely caused by a virus. There is not specific treatment for viral sinusitis other than to help you with the symptoms until the infection runs its course.  You may use an oral decongestant such as Mucinex D or if you have glaucoma or high blood pressure use plain Mucinex. Saline nasal spray help and can safely be used as often as needed for congestion, I have prescribed: Ipratropium Bromide nasal spray 0.03% 2 sprays in eah nostril 2-3 times a day  We do not typically recommend antibiotic use prior to 7-10 days of symptoms  Providers prescribe antibiotics to treat infections caused by bacteria. Antibiotics are very powerful in treating bacterial infections when they are used properly. To maintain their effectiveness, they should be used only when necessary. Overuse of antibiotics has resulted in the development of superbugs that are resistant to treatment!    After careful review of your answers, I would not recommend an antibiotic for your condition.  Antibiotics are not effective against viruses and therefore should not be used to treat them. Common examples of infections caused by viruses include colds and flu   Some authorities believe that zinc sprays or the use of Echinacea may shorten the course of your symptoms.  Sinus infections are not as easily transmitted as other respiratory infection, however we still recommend that you avoid close contact with loved ones, especially the very young and elderly.  Remember to wash your hands thoroughly throughout the day as this is the number one way to prevent the spread of infection!  Home Care: Only  take medications as instructed by your medical team. Do not take these medications with alcohol. A steam or ultrasonic humidifier can help congestion.  You can place a towel over your head and breathe in the steam from hot water coming from a faucet. Avoid close contacts especially the very young and the elderly. Cover your mouth when you cough or sneeze. Always remember to wash your hands.  Get Help Right Away If: You develop worsening fever or sinus pain. You develop a severe head ache or visual changes. Your symptoms persist after you have completed your treatment plan.  Make sure you Understand these instructions. Will watch your condition. Will get help right away if you are not doing well or get worse.   Thank you for choosing an e-visit.  Your e-visit answers were reviewed by a board certified advanced clinical practitioner to complete your personal care plan. Depending upon the condition, your plan could have included both over the counter or prescription medications.  Please review your pharmacy choice. Make sure the pharmacy is open so you can pick up prescription now. If there is a problem, you may contact your provider through [employer] and have the prescription routed to another pharmacy.  Your safety is important to [place]. If you have drug allergies check your prescription carefully.   For the next 24 hours you can use MyChart to ask questions about today's visit, request a non-urgent call back, or ask for a work or school excuse. You will get an email in the next two days  asking about your experience. I hope that your e-visit has been valuable and will speed your recovery.    Meds ordered this encounter  Medications   ipratropium (ATROVENT) 0.03 % nasal spray    Sig: Place 2 sprays into both nostrils every 12 (twelve) hours.    Dispense:  30 mL    Refill:  12    I spent approximately 5 minutes reviewing the patient's history, current symptoms and coordinating  their care today.

## 2023-09-17 ENCOUNTER — Other Ambulatory Visit: Payer: Self-pay | Admitting: Nurse Practitioner

## 2023-10-18 ENCOUNTER — Other Ambulatory Visit: Payer: Self-pay | Admitting: Internal Medicine

## 2023-10-19 ENCOUNTER — Other Ambulatory Visit: Payer: Self-pay | Admitting: Nurse Practitioner

## 2023-10-20 ENCOUNTER — Other Ambulatory Visit: Payer: Self-pay | Admitting: Internal Medicine

## 2023-12-06 ENCOUNTER — Ambulatory Visit: Payer: BC Managed Care – PPO | Admitting: Internal Medicine

## 2023-12-06 ENCOUNTER — Encounter: Payer: Self-pay | Admitting: Internal Medicine

## 2023-12-06 VITALS — BP 148/84 | HR 82 | Temp 98.3°F | Resp 16 | Ht 66.0 in | Wt 174.2 lb

## 2023-12-06 DIAGNOSIS — Z0001 Encounter for general adult medical examination with abnormal findings: Secondary | ICD-10-CM

## 2023-12-06 DIAGNOSIS — I10 Essential (primary) hypertension: Secondary | ICD-10-CM | POA: Diagnosis not present

## 2023-12-06 LAB — CBC WITH DIFFERENTIAL/PLATELET
Basophils Absolute: 0 10*3/uL (ref 0.0–0.1)
Basophils Relative: 0.3 % (ref 0.0–3.0)
Eosinophils Absolute: 0.2 10*3/uL (ref 0.0–0.7)
Eosinophils Relative: 1.6 % (ref 0.0–5.0)
HCT: 40 % (ref 36.0–46.0)
Hemoglobin: 13.5 g/dL (ref 12.0–15.0)
Lymphocytes Relative: 30.7 % (ref 12.0–46.0)
Lymphs Abs: 3 10*3/uL (ref 0.7–4.0)
MCHC: 33.7 g/dL (ref 30.0–36.0)
MCV: 102.9 fL — ABNORMAL HIGH (ref 78.0–100.0)
Monocytes Absolute: 0.6 10*3/uL (ref 0.1–1.0)
Monocytes Relative: 6.5 % (ref 3.0–12.0)
Neutro Abs: 6 10*3/uL (ref 1.4–7.7)
Neutrophils Relative %: 60.9 % (ref 43.0–77.0)
Platelets: 368 10*3/uL (ref 150.0–400.0)
RBC: 3.89 Mil/uL (ref 3.87–5.11)
RDW: 11.8 % (ref 11.5–15.5)
WBC: 9.8 10*3/uL (ref 4.0–10.5)

## 2023-12-06 LAB — BASIC METABOLIC PANEL
BUN: 15 mg/dL (ref 6–23)
CO2: 26 meq/L (ref 19–32)
Calcium: 9.7 mg/dL (ref 8.4–10.5)
Chloride: 102 meq/L (ref 96–112)
Creatinine, Ser: 0.83 mg/dL (ref 0.40–1.20)
GFR: 81.25 mL/min (ref >60.00)
Glucose, Bld: 102 mg/dL — ABNORMAL HIGH (ref 70–99)
Potassium: 4 meq/L (ref 3.5–5.1)
Sodium: 137 meq/L (ref 135–145)

## 2023-12-06 LAB — LIPID PANEL
Cholesterol: 152 mg/dL (ref 0–200)
HDL: 69.9 mg/dL (ref >39.00)
LDL Cholesterol: 45 mg/dL (ref 0–99)
NonHDL: 82
Total CHOL/HDL Ratio: 2
Triglycerides: 184 mg/dL — ABNORMAL HIGH (ref 0.0–149.0)
VLDL: 36.8 mg/dL (ref 0.0–40.0)

## 2023-12-06 LAB — HEPATIC FUNCTION PANEL
ALT: 15 U/L (ref 0–35)
AST: 19 U/L (ref 0–37)
Albumin: 4.6 g/dL (ref 3.5–5.2)
Alkaline Phosphatase: 34 U/L — ABNORMAL LOW (ref 39–117)
Bilirubin, Direct: 0.1 mg/dL (ref 0.0–0.3)
Total Bilirubin: 0.4 mg/dL (ref 0.2–1.2)
Total Protein: 7.6 g/dL (ref 6.0–8.3)

## 2023-12-06 LAB — TSH: TSH: 0.94 u[IU]/mL (ref 0.35–5.50)

## 2023-12-06 NOTE — Progress Notes (Unsigned)
 Subjective:  Patient ID: Teresa Blankenship, female    DOB: Nov 21, 1970  Age: 53 y.o. MRN: [id number]  CC: Hypertension and Annual Exam   HPI Teresa Blankenship presents for a CPX and f/up ----  Discussed the use of AI scribe software for clinical note transcription with the patient, who gave verbal consent to proceed.  History of Present Illness   Teresa Blankenship is a 53 year old female with hypertension who presents for a routine follow-up visit.  She is currently taking indapamide daily for blood pressure management. No symptoms such as headaches, blurred vision, chest pain, shortness of breath, dizziness, or lightheadedness. She is not taking methocarbamol, prednisone, or a cough suppressant. She has stopped taking an antibiotic and is not using ipratropium nasal spray. She uses Flecainide.  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       Outpatient Medications Prior to Visit  Medication Sig Dispense Refill   drospirenone-ethinyl estradiol (YAZ) 3-0.02 MG tablet Take 1 tablet by mouth daily.     fluticasone (FLONASE) 50 MCG/ACT nasal spray Place 2 sprays into both nostrils daily. 16 g 6   levocetirizine (XYZAL) 5 MG tablet TAKE 1 TABLET BY MOUTH EVERY DAY IN THE EVENING 90 tablet 1   Multiple Vitamins-Minerals (WOMENS DAILY FORMULA PO) Take by mouth.     vitamin C (ASCORBIC ACID) 500 MG tablet Take 500 mg by mouth daily.     VITAMIN D PO Take by mouth.     Zinc 30 MG TABS Take by mouth.     indapamide (LOZOL) 1.25 MG tablet TAKE 1 TABLET BY MOUTH EVERY DAY 90 tablet 0   azelastine (ASTELIN) 0.1 % nasal spray Place 2 sprays into both nostrils 2 (two) times daily. Use in each nostril as directed 30 mL 0   benzonatate (TESSALON) 100 MG capsule Take 1 capsule (100 mg total) by mouth 2 (two) times daily as needed for cough. 20 capsule 0   doxycycline (VIBRA-TABS) 100 MG tablet Take 1 tablet (100 mg total) by mouth  2 (two) times daily. 20 tablet 0   Ferrous Sulfate (IRON) 325 (65 Fe) MG TABS Take by mouth daily.     ipratropium (ATROVENT) 0.03 % nasal spray Place 2 sprays into both nostrils every 12 (twelve) hours. 30 mL 12   methocarbamol (ROBAXIN) 500 MG tablet Take 1 tablet (500 mg total) by mouth every 8 (eight) hours as needed for muscle spasms. 30 tablet 0   nebivolol (BYSTOLIC) 5 MG tablet Take 1 tablet (5 mg total) by mouth daily. 90 tablet 0   predniSONE (STERAPRED UNI-PAK 21 TAB) 5 MG (21) TBPK tablet Take dosepak as directed 21 tablet 0   No facility-administered medications prior to visit.    ROS Review of Systems  Objective:  BP (!) 148/84 (BP Location: Left Arm, Patient Position: Sitting, Cuff Size: Normal) Comment: BP (R) 150/86  Pulse 82   Temp 98.3 F (36.8 C) (Oral)   Resp 16   Ht 5\' 6"  (1.676 m)   Wt 174 lb 3.2 oz (79 kg)   LMP 11/11/2023   SpO2 99%   BMI 28.12 kg/m   BP Readings from Last 3 Encounters:  12/06/23 (!) 148/84  07/20/22 (!) 152/88  03/02/22 132/84    Wt Readings from Last 3 Encounters:  12/06/23 174 lb 3.2 oz (79 kg)  07/20/22 188 lb (85.3 kg)  03/02/22 184 lb (83.5 kg)  Physical Exam Cardiovascular:     Rate and Rhythm: Normal rate and regular rhythm.     Heart sounds: Normal heart sounds, S1 normal and S2 normal. No murmur heard.    Comments: EKG---  NSR, 79 bpm No LVH, Q waves, or ST/T wave changes  Musculoskeletal:     Right lower leg: No edema.     Left lower leg: No edema.     Lab Results  Component Value Date   WBC 9.8 12/06/2023   HGB 13.5 12/06/2023   HCT 40.0 12/06/2023   PLT 368.0 12/06/2023   GLUCOSE 102 (H) 12/06/2023   CHOL 152 12/06/2023   TRIG 184.0 (H) 12/06/2023   HDL 69.90 12/06/2023   LDLDIRECT 59.0 02/08/2021   LDLCALC 45 12/06/2023   ALT 15 12/06/2023   AST 19 12/06/2023   NA 137 12/06/2023   K 4.0 12/06/2023   CL 102 12/06/2023   CREATININE 0.83 12/06/2023   BUN 15 12/06/2023   CO2 26 12/06/2023    TSH 0.94 12/06/2023   INR 1.0 09/18/2019   HGBA1C 4.7 06/27/2018    MR Cervical Spine w/o contrast Result Date: 12/18/2022 CLINICAL DATA:  Left radicular arm pain. Left-sided neck pain radiating into the left shoulder. EXAM: MRI CERVICAL SPINE WITHOUT CONTRAST TECHNIQUE: Multiplanar, multisequence MR imaging of the cervical spine was performed. No intravenous contrast was administered. COMPARISON:  MRI cervical spine 05/07/2017. FINDINGS: Alignment: Normal. Vertebrae: Normal vertebral body heights and marrow signal. Cord: Normal spinal cord signal and volume. Posterior Fossa, vertebral arteries, paraspinal tissues: Unremarkable. Disc levels: C2-C3: New left central disc protrusion with mild deformity of the left ventral cord. Mild bilateral facet arthropathy. No neural foraminal narrowing. C3-C4: Unchanged small disc bulge without significant spinal canal stenosis or neural foraminal narrowing. C4-C5: No disc herniation or spinal canal stenosis. Facet arthropathy and uncovertebral joint spurring results in moderate right neural foraminal narrowing, unchanged. C5-C6: Increased disc bulge now results in moderate spinal canal stenosis. Facet arthropathy and uncovertebral joint spurring contribute to severe bilateral neural foraminal narrowing, unchanged. C6-C7: Central disc-osteophyte complex results in severe spinal canal stenosis, unchanged. Left-greater-than-right facet arthropathy and uncovertebral joint spurring results in severe left neural foraminal narrowing, unchanged. C7-T1: Small central disc protrusion without spinal canal stenosis or neural foraminal narrowing. IMPRESSION: 1. Progression of multilevel cervical spondylosis with new left central disc protrusion at C2-3 resulting in mild deformity of the left ventral cord. 2. Slightly increased disc bulge at C5-6 now results in moderate spinal canal stenosis. Unchanged severe bilateral neural foraminal narrowing at this level. 3. Unchanged severe spinal  canal stenosis and severe left neural foraminal narrowing at C6-7. Electronically Signed   By: Orvan Falconer M.D.   On: 12/18/2022 15:57    Assessment & Plan:  Essential hypertension -     EKG 12-Lead -     TSH; Future -     Urinalysis, Routine w reflex microscopic; Future -     Hepatic function panel; Future -     Basic metabolic panel; Future -     CBC with Differential/Platelet; Future -     Indapamide; Take 1 tablet (1.25 mg total) by mouth daily.  Dispense: 90 tablet; Refill: 1 -     amLODIPine Besylate; Take 1 tablet (5 mg total) by mouth daily.  Dispense: 90 tablet; Refill: 1  Encounter for general adult medical examination with abnormal findings -     Lipid panel; Future     Follow-up: Return in about 6 months (around 06/04/2024).  Sanda Linger, MD

## 2023-12-06 NOTE — Patient Instructions (Signed)

## 2023-12-07 LAB — URINALYSIS, ROUTINE W REFLEX MICROSCOPIC
Bilirubin Urine: NEGATIVE
Ketones, ur: NEGATIVE
Leukocytes,Ua: NEGATIVE
Nitrite: NEGATIVE
Specific Gravity, Urine: 1.02 (ref 1.000–1.030)
Total Protein, Urine: NEGATIVE
Urine Glucose: NEGATIVE
Urobilinogen, UA: 0.2 (ref 0.0–1.0)
pH: 7 (ref 5.0–8.0)

## 2023-12-07 MED ORDER — AMLODIPINE BESYLATE 5 MG PO TABS: 5.0000 mg | ORAL_TABLET | Freq: Every day | ORAL | 1 refills | Status: AC

## 2023-12-07 MED ORDER — INDAPAMIDE 1.25 MG PO TABS: 1.2500 mg | ORAL_TABLET | Freq: Every day | ORAL | 1 refills | Status: DC

## 2023-12-08 ENCOUNTER — Encounter: Payer: Self-pay | Admitting: Internal Medicine

## 2023-12-19 ENCOUNTER — Telehealth: Admitting: Physician Assistant

## 2023-12-19 DIAGNOSIS — B9689 Other specified bacterial agents as the cause of diseases classified elsewhere: Secondary | ICD-10-CM

## 2023-12-19 DIAGNOSIS — J019 Acute sinusitis, unspecified: Secondary | ICD-10-CM | POA: Diagnosis not present

## 2023-12-19 MED ORDER — AMOXICILLIN-POT CLAVULANATE 875-125 MG PO TABS: 1.0000 | ORAL_TABLET | Freq: Two times a day (BID) | ORAL | 0 refills | Status: DC

## 2023-12-19 NOTE — Progress Notes (Signed)

## 2023-12-19 NOTE — Progress Notes (Signed)
 I have spent 5 minutes in review of e-visit questionnaire, review and updating patient chart, medical decision making and response to patient.   Piedad Climes, PA-C

## 2024-01-10 ENCOUNTER — Other Ambulatory Visit: Payer: Self-pay | Admitting: Internal Medicine

## 2024-02-12 ENCOUNTER — Telehealth: Payer: Self-pay | Admitting: Internal Medicine

## 2024-02-12 ENCOUNTER — Other Ambulatory Visit: Payer: Self-pay | Admitting: Internal Medicine

## 2024-02-12 DIAGNOSIS — J301 Allergic rhinitis due to pollen: Secondary | ICD-10-CM

## 2024-02-12 MED ORDER — LEVOCETIRIZINE DIHYDROCHLORIDE 5 MG PO TABS: 5.0000 mg | ORAL_TABLET | Freq: Every evening | ORAL | 0 refills | Status: DC

## 2024-02-12 NOTE — Telephone Encounter (Signed)
 Last Fill: 04/07/22  Last OV: 12/06/23 Next OV: None Scheduled  Routing to provider for review/authorization.

## 2024-02-12 NOTE — Telephone Encounter (Signed)
 Copied from CRM [id number]. Topic: Clinical - Prescription Issue >> Feb 12, 2024 12:58 PM [name] wrote: Reason for CRM: Patient inquiring on status of medication refill of Fluticasone  Propionate 50 MCG/ACT

## 2024-02-12 NOTE — Telephone Encounter (Signed)
 Copied from CRM [id number]. Topic: Clinical - Medication Refill >> Feb 12, 2024 12:54 PM [name] wrote: Most Recent Primary Care Visit:  Provider: Arcadio Knuckles  Department: [facility]  Visit Type: PHYSICAL/AWV  Date: 12/06/2023  Medication: levocetirizine (XYZAL ) 5 MG tablet  Has the patient contacted their pharmacy? No (Agent: If no, request that the patient contact the pharmacy for the refill. If patient does not wish to contact the pharmacy document the reason why and proceed with request.) (Agent: If yes, when and what did the pharmacy advise?)  Is this the correct pharmacy for this prescription? Yes If no, delete pharmacy and type the correct one.  This is the patient's preferred pharmacy:   [id number] - [place], [place] - [address] AT [address] [place] [address] Phone: [phone] Fax: [phone]  Has the prescription been filled recently? No  Is the patient out of the medication? Yes  Has the patient been seen for an appointment in the last year OR does the patient have an upcoming appointment? Yes  Can we respond through MyChart? Yes  Agent: Please be advised that Rx refills may take up to 3 business days. We ask that you follow-up with your pharmacy.

## 2024-02-14 ENCOUNTER — Other Ambulatory Visit: Payer: Self-pay

## 2024-02-14 MED ORDER — FLUTICASONE PROPIONATE 50 MCG/ACT NA SUSP: 2.0000 | Freq: Every day | NASAL | 2 refills | Status: AC

## 2024-02-14 NOTE — Telephone Encounter (Signed)
 Medication has been refilled and sent to her pharmacy. Left message of the refill.

## 2024-03-04 ENCOUNTER — Telehealth: Admitting: Family Medicine

## 2024-03-04 DIAGNOSIS — R399 Unspecified symptoms and signs involving the genitourinary system: Secondary | ICD-10-CM | POA: Diagnosis not present

## 2024-03-04 MED ORDER — NITROFURANTOIN MONOHYD MACRO 100 MG PO CAPS: 100.0000 mg | ORAL_CAPSULE | Freq: Two times a day (BID) | ORAL | 0 refills | Status: AC

## 2024-03-04 NOTE — Progress Notes (Signed)
E-Visit for Urinary Problems  We are sorry that you are not feeling well.  Here is how we plan to help!  Based on what you shared with me it looks like you most likely have a simple urinary tract infection.  A UTI (Urinary Tract Infection) is a bacterial infection of the bladder.  Most cases of urinary tract infections are simple to treat but a key part of your care is to encourage you to drink plenty of fluids and watch your symptoms carefully.  I have prescribed MacroBid 100 mg twice a day for 5 days.  Your symptoms should gradually improve. Call us if the burning in your urine worsens, you develop worsening fever, back pain or pelvic pain or if your symptoms do not resolve after completing the antibiotic.  Urinary tract infections can be prevented by drinking plenty of water to keep your body hydrated.  Also be sure when you wipe, wipe from front to back and don't hold it in!  If possible, empty your bladder every 4 hours.  HOME CARE Drink plenty of fluids Compete the full course of the antibiotics even if the symptoms resolve Remember, when you need to go.go. Holding in your urine can increase the likelihood of getting a UTI! GET HELP RIGHT AWAY IF: You cannot urinate You get a high fever Worsening back pain occurs You see blood in your urine You feel sick to your stomach or throw up You feel like you are going to pass out  MAKE SURE YOU  Understand these instructions. Will watch your condition. Will get help right away if you are not doing well or get worse.   Thank you for choosing an e-visit.  Your e-visit answers were reviewed by a board certified advanced clinical practitioner to complete your personal care plan. Depending upon the condition, your plan could have included both over the counter or prescription medications.  Please review your pharmacy choice. Make sure the pharmacy is open so you can pick up prescription now. If there is a problem, you may contact your  provider through [employer] and have the prescription routed to another pharmacy.  Your safety is important to [place]. If you have drug allergies check your prescription carefully.   For the next 24 hours you can use MyChart to ask questions about today's visit, request a non-urgent call back, or ask for a work or school excuse. You will get an email in the next two days asking about your experience. I hope that your e-visit has been valuable and will speed your recovery.  I have spent 5 minutes in review of e-visit questionnaire, review and updating patient chart, medical decision making and response to patient.   Reed Pandy, PA-C

## 2024-05-12 ENCOUNTER — Other Ambulatory Visit: Payer: Self-pay | Admitting: Internal Medicine

## 2024-05-12 DIAGNOSIS — J301 Allergic rhinitis due to pollen: Secondary | ICD-10-CM

## 2024-05-14 NOTE — Telephone Encounter (Signed)
 Last OV 12/06/23 Next OV not scheduled  Last refill 02/12/24 Qty #90/0

## 2024-06-06 ENCOUNTER — Telehealth: Admitting: Physician Assistant

## 2024-06-06 DIAGNOSIS — J019 Acute sinusitis, unspecified: Secondary | ICD-10-CM

## 2024-06-06 DIAGNOSIS — B9689 Other specified bacterial agents as the cause of diseases classified elsewhere: Secondary | ICD-10-CM | POA: Diagnosis not present

## 2024-06-06 MED ORDER — AMOXICILLIN-POT CLAVULANATE 875-125 MG PO TABS
1.0000 | ORAL_TABLET | Freq: Two times a day (BID) | ORAL | 0 refills | Status: DC
Start: 2024-06-06 — End: 2024-07-24

## 2024-06-06 NOTE — Progress Notes (Signed)
 I have spent 5 minutes in review of e-visit questionnaire, review and updating patient chart, medical decision making and response to patient.   Elsie Velma Lunger, PA-C

## 2024-06-06 NOTE — Progress Notes (Signed)

## 2024-07-24 ENCOUNTER — Telehealth: Admitting: Physician Assistant

## 2024-07-24 DIAGNOSIS — J019 Acute sinusitis, unspecified: Secondary | ICD-10-CM

## 2024-07-24 DIAGNOSIS — B9689 Other specified bacterial agents as the cause of diseases classified elsewhere: Secondary | ICD-10-CM

## 2024-07-24 MED ORDER — DOXYCYCLINE HYCLATE 100 MG PO TABS: 100.0000 mg | ORAL_TABLET | Freq: Two times a day (BID) | ORAL | 0 refills | Status: AC

## 2024-07-24 NOTE — Progress Notes (Signed)
 E-Visit for Sinus Problems  We are sorry that you are not feeling well.  Here is how we plan to help!  Based on what you have shared with me it looks like you have sinusitis.  Sinusitis is inflammation and infection in the sinus cavities of the head.  Based on your presentation I believe you most likely have Acute Bacterial Sinusitis.  This is an infection caused by bacteria and is treated with antibiotics. I have prescribed Doxycycline  100mg  by mouth twice a day for 7 days. You may use an oral decongestant such as Mucinex  D or if you have glaucoma or high blood pressure use plain Mucinex . Saline nasal spray help and can safely be used as often as needed for congestion.  If you develop worsening sinus pain, fever or notice severe headache and vision changes, or if symptoms are not better after completion of antibiotic, please schedule an appointment with a health care provider.    Sinus infections are not as easily transmitted as other respiratory infection, however we still recommend that you avoid close contact with loved ones, especially the very young and elderly.  Remember to wash your hands thoroughly throughout the day as this is the number one way to prevent the spread of infection!  Continue Xyzal  and Flonase  as prescribed.   Home Care: Only take medications as instructed by your medical team. Complete the entire course of an antibiotic. Do not take these medications with alcohol. A steam or ultrasonic humidifier can help congestion.  You can place a towel over your head and breathe in the steam from hot water coming from a faucet. Avoid close contacts especially the very young and the elderly. Cover your mouth when you cough or sneeze. Always remember to wash your hands.  Get Help Right Away If: You develop worsening fever or sinus pain. You develop a severe head ache or visual changes. Your symptoms persist after you have completed your treatment plan.  Make sure  you Understand these instructions. Will watch your condition. Will get help right away if you are not doing well or get worse.  Your e-visit answers were reviewed by a board certified advanced clinical practitioner to complete your personal care plan.  Depending on the condition, your plan could have included both over the counter or prescription medications.  If there is a problem please reply  once you have received a response from your provider.  Your safety is important to us .  If you have drug allergies check your prescription carefully.    You can use MyChart to ask questions about today's visit, request a non-urgent call back, or ask for a work or school excuse for 24 hours related to this e-Visit. If it has been greater than 24 hours you will need to follow up with your provider, or enter a new e-Visit to address those concerns.  You will get an e-mail in the next two days asking about your experience.  I hope that your e-visit has been valuable and will speed your recovery. Thank you for using e-visits.  I have spent 5 minutes in review of e-visit questionnaire, review and updating patient chart, medical decision making and response to patient.   Delon CHRISTELLA Dickinson, PA-C

## 2024-08-15 DIAGNOSIS — Z3041 Encounter for surveillance of contraceptive pills: Secondary | ICD-10-CM | POA: Diagnosis not present

## 2024-08-15 DIAGNOSIS — Z01419 Encounter for gynecological examination (general) (routine) without abnormal findings: Secondary | ICD-10-CM | POA: Diagnosis not present

## 2024-08-15 DIAGNOSIS — Z1231 Encounter for screening mammogram for malignant neoplasm of breast: Secondary | ICD-10-CM | POA: Diagnosis not present

## 2024-08-15 DIAGNOSIS — Z124 Encounter for screening for malignant neoplasm of cervix: Secondary | ICD-10-CM | POA: Diagnosis not present

## 2024-09-09 ENCOUNTER — Other Ambulatory Visit: Payer: Self-pay | Admitting: Internal Medicine

## 2024-09-09 DIAGNOSIS — I10 Essential (primary) hypertension: Secondary | ICD-10-CM

## 2024-11-09 ENCOUNTER — Other Ambulatory Visit: Payer: Self-pay | Admitting: Internal Medicine

## 2024-11-09 DIAGNOSIS — I10 Essential (primary) hypertension: Secondary | ICD-10-CM
